# Patient Record
Sex: Female | Born: 1945 | Race: White | Hispanic: No | Smoking: Former smoker
Health system: Southern US, Community
[De-identification: ages and names within clinical notes are randomized; demographics above are authoritative.]

## PROBLEM LIST (undated history)

## (undated) DIAGNOSIS — C801 Malignant (primary) neoplasm, unspecified: Secondary | ICD-10-CM

## (undated) DIAGNOSIS — R609 Edema, unspecified: Secondary | ICD-10-CM

## (undated) DIAGNOSIS — I493 Ventricular premature depolarization: Secondary | ICD-10-CM

## (undated) DIAGNOSIS — Z6821 Body mass index (BMI) 21.0-21.9, adult: Secondary | ICD-10-CM

## (undated) DIAGNOSIS — R918 Other nonspecific abnormal finding of lung field: Secondary | ICD-10-CM

## (undated) DIAGNOSIS — J479 Bronchiectasis, uncomplicated: Secondary | ICD-10-CM

## (undated) DIAGNOSIS — C4362 Malignant melanoma of left upper limb, including shoulder: Secondary | ICD-10-CM

## (undated) DIAGNOSIS — R008 Other abnormalities of heart beat: Secondary | ICD-10-CM

## (undated) DIAGNOSIS — M21612 Bunion of left foot: Secondary | ICD-10-CM

## (undated) DIAGNOSIS — G4733 Obstructive sleep apnea (adult) (pediatric): Secondary | ICD-10-CM

## (undated) DIAGNOSIS — E785 Hyperlipidemia, unspecified: Secondary | ICD-10-CM

## (undated) DIAGNOSIS — M21611 Bunion of right foot: Secondary | ICD-10-CM

## (undated) HISTORY — DX: Body mass index (BMI) 21.0-21.9, adult: Z68.21

## (undated) HISTORY — DX: Malignant melanoma of left upper limb, including shoulder: C43.62

## (undated) HISTORY — DX: Ventricular premature depolarization: I49.3

## (undated) HISTORY — DX: Other nonspecific abnormal finding of lung field: R91.8

## (undated) HISTORY — DX: Bronchiectasis, uncomplicated: J47.9

## (undated) HISTORY — DX: Bunion of right foot: M21.612

## (undated) HISTORY — PX: FOOT SURGERY: SHX648

## (undated) HISTORY — DX: Obstructive sleep apnea (adult) (pediatric): G47.33

## (undated) HISTORY — DX: Bunion of right foot: M21.611

## (undated) HISTORY — DX: Hyperlipidemia, unspecified: E78.5

## (undated) HISTORY — DX: Edema, unspecified: R60.9

## (undated) HISTORY — DX: Other abnormalities of heart beat: R00.8

## (undated) HISTORY — DX: Malignant (primary) neoplasm, unspecified: C80.1

## (undated) HISTORY — PX: OTHER SURGICAL HISTORY: SHX169

---

## 2007-04-24 ENCOUNTER — Encounter: Admission: RE | Admit: 2007-04-24 | Discharge: 2007-04-24 | Payer: Self-pay | Admitting: Family Medicine

## 2007-05-29 ENCOUNTER — Encounter (INDEPENDENT_AMBULATORY_CARE_PROVIDER_SITE_OTHER): Payer: Self-pay | Admitting: General Surgery

## 2007-05-29 ENCOUNTER — Ambulatory Visit (HOSPITAL_BASED_OUTPATIENT_CLINIC_OR_DEPARTMENT_OTHER): Admission: RE | Admit: 2007-05-29 | Discharge: 2007-05-29 | Payer: Self-pay | Admitting: General Surgery

## 2011-05-08 NOTE — Op Note (Signed)
NAME:  Leah, Steele NO.:  [id number]   MEDICAL RECORD NO.:  [id number]          PATIENT TYPE:  AMB   LOCATION:  DSC                          FACILITY:  [facility]   PHYSICIAN:  Cherylynn Ridges, M.D.    DATE OF BIRTH:  [birth year]   DATE OF PROCEDURE:  05/29/2007  DATE OF DISCHARGE:                               OPERATIVE REPORT   PREOPERATIVE DIAGNOSES:  1. Melanoma of left upper extremity, superficial spreading type.  2. Lipoma of the right upper extremity.   POSTOPERATIVE DIAGNOSES:  1. Melanoma of left upper extremity, superficial spreading type.  2. Lipoma of the right upper extremity.   PROCEDURE PERFORMED:  1. Wide excision of melanoma of left upper extremity, superficial      spreading melanoma.  2. Attempted sentinel lymph node biopsy with random axillary fatty and      possible lymph node biopsy.  3. Excision of right upper extremity lipoma.   SURGEON:  Cherylynn Ridges, M.D.   ANESTHESIA:  General with laryngeal airway.   ESTIMATED BLOOD LOSS:  Less than 20 mL.   COMPLICATIONS:  Could not localize the radioactivity or the vital blue  injection in the axilla and therefore could not localize the sentinel  lymph node.   POSTOPERATIVE CONDITION:  Stable.   DESCRIPTION OF PROCEDURE:  The patient was taken to the operating room  and placed on the table in the supine position.  After an adequate  general laryngeal airway anesthetic was administered, we prepped her  actually in stages -- initially doing the right upper lobe lipoma, then  prepping her for the wide excision of the left upper extremity posterior  shoulder melanoma, and then prepping her for the axilla.   With her right arm stretched across the body and tilted towards the  right in the reverse Trendelenburg position, we localized the area on  the right upper extremity above the elbow where she had a firm nodule,  thought to possibly be a lipoma, but could not say for certainty.  We  made a  longitudinal incision and then easily dissected out about a 1.5  cm lipoma with minimal difficulty.  There were several other nodules in  that area.  However, we did not dissect them away or biopsy them.  We  closed this area using a running subcuticular stitch of 5-0 Monocryl.   We next placed the patient's right arm out in a right angle to her body,  and then placed her left arm across her body and rotated her towards the  right and reversed Trendelenburg where she had this 2 cm lesion on the  back of her left shoulder.  We prepped and draped in the usual sterile  manner, exposing this area.  We marked the area of excision and then  made a wide incision around it, getting at least a centimeter margin  from the edge of the previously reflected melanoma.  We took it down to  the muscle fascia, including the subcutaneous tissue.  We irrigated with  saline, and then closed with a 3-0 Vicryl deep to the subcu layer, and  then the skin was closed using interrupted simple stitches of 3-0 nylon.  All counts were correct at this point, and we left it closed.   We then placed the left arm out at right angles to the body, exposing  the axilla.  We prepped and draped in the usual sterile manner.  We used  the needle probe to localize an area of radioactivity at the highest  activity which appeared to be in the mid to upper aspect of the lateral  part of the axilla.  An incision perpendicular to the humerus was made  into the lateral aspect of the axilla and taken down to the tissue.  We  got into the axillary fatty contents.  However, deep in the axilla and  below the axillary vein, there was no radioactive activity.  We spent  several minutes trying to localize activity and the only area of high  radioactivity appeared to be very deep, sort of deeper to the blood  vessels and up towards the bone into the arm contents away from the  axilla.  There was no radioactivity in the true axilla.  After  spending  several minutes looking for sentinel lymph nodes using a Mayo probe and  also visually looking for the vital blue injection, none was noted.  Without wanting to injure any veins or nerves, we decided to just take  some random biopsies of the fatty tissue, perhaps scavenging some lymph  nodes, and we sent those off as specimen.  We closed in two layers with  3-0 Vicryl in the subcu layer; and then the skin was closed using  stainless steel staples.  All counts were correct.  We irrigated with  saline prior to closure.      Cherylynn Ridges, M.D.  Electronically Signed     JOW/MEDQ  D:  05/29/2007  T:  05/29/2007  Job:  [id number]

## 2011-10-11 LAB — BASIC METABOLIC PANEL
Calcium: 9.2
Creatinine, Ser: 0.72
GFR calc Af Amer: >60
GFR calc non Af Amer: >60
Glucose, Bld: 110 — ABNORMAL HIGH
Sodium: 137

## 2011-10-11 LAB — DIFFERENTIAL
Basophils Absolute: 0
Lymphocytes Relative: 12
Monocytes Absolute: 0.3
Neutro Abs: 9.1 — ABNORMAL HIGH
Neutrophils Relative %: 86 — ABNORMAL HIGH

## 2011-10-11 LAB — CBC
Hemoglobin: 13.5
RBC: 4.34
RDW: 12.6

## 2013-09-29 DIAGNOSIS — D2360 Other benign neoplasm of skin of unspecified upper limb, including shoulder: Secondary | ICD-10-CM | POA: Diagnosis not present

## 2013-09-29 DIAGNOSIS — C4330 Malignant melanoma of unspecified part of face: Secondary | ICD-10-CM | POA: Diagnosis not present

## 2013-09-29 DIAGNOSIS — D485 Neoplasm of uncertain behavior of skin: Secondary | ICD-10-CM | POA: Diagnosis not present

## 2013-09-29 DIAGNOSIS — D239 Other benign neoplasm of skin, unspecified: Secondary | ICD-10-CM | POA: Diagnosis not present

## 2013-09-29 DIAGNOSIS — Z85820 Personal history of malignant melanoma of skin: Secondary | ICD-10-CM | POA: Diagnosis not present

## 2013-09-29 DIAGNOSIS — L819 Disorder of pigmentation, unspecified: Secondary | ICD-10-CM | POA: Diagnosis not present

## 2013-10-08 DIAGNOSIS — Z85820 Personal history of malignant melanoma of skin: Secondary | ICD-10-CM | POA: Insufficient documentation

## 2013-10-08 DIAGNOSIS — C44390 Other specified malignant neoplasm of skin of unspecified parts of face: Secondary | ICD-10-CM | POA: Diagnosis not present

## 2013-10-08 HISTORY — DX: Personal history of malignant melanoma of skin: Z85.820

## 2013-10-17 DIAGNOSIS — J069 Acute upper respiratory infection, unspecified: Secondary | ICD-10-CM | POA: Diagnosis not present

## 2013-11-12 ENCOUNTER — Other Ambulatory Visit: Payer: Self-pay | Admitting: Dermatology

## 2013-11-12 DIAGNOSIS — Z85820 Personal history of malignant melanoma of skin: Secondary | ICD-10-CM | POA: Diagnosis not present

## 2013-11-12 DIAGNOSIS — D239 Other benign neoplasm of skin, unspecified: Secondary | ICD-10-CM | POA: Diagnosis not present

## 2013-11-12 DIAGNOSIS — D485 Neoplasm of uncertain behavior of skin: Secondary | ICD-10-CM | POA: Diagnosis not present

## 2013-11-12 DIAGNOSIS — L259 Unspecified contact dermatitis, unspecified cause: Secondary | ICD-10-CM | POA: Diagnosis not present

## 2013-11-13 DIAGNOSIS — D229 Melanocytic nevi, unspecified: Secondary | ICD-10-CM | POA: Insufficient documentation

## 2013-11-13 DIAGNOSIS — D239 Other benign neoplasm of skin, unspecified: Secondary | ICD-10-CM | POA: Diagnosis not present

## 2013-11-13 HISTORY — DX: Melanocytic nevi, unspecified: D22.9

## 2013-11-16 DIAGNOSIS — Z1231 Encounter for screening mammogram for malignant neoplasm of breast: Secondary | ICD-10-CM | POA: Diagnosis not present

## 2013-11-16 DIAGNOSIS — Z124 Encounter for screening for malignant neoplasm of cervix: Secondary | ICD-10-CM | POA: Diagnosis not present

## 2013-11-16 DIAGNOSIS — Z0000 Encounter for general adult medical examination without abnormal findings: Secondary | ICD-10-CM | POA: Diagnosis not present

## 2013-11-16 DIAGNOSIS — R21 Rash and other nonspecific skin eruption: Secondary | ICD-10-CM | POA: Diagnosis not present

## 2013-12-15 DIAGNOSIS — J111 Influenza due to unidentified influenza virus with other respiratory manifestations: Secondary | ICD-10-CM | POA: Diagnosis not present

## 2013-12-28 DIAGNOSIS — R05 Cough: Secondary | ICD-10-CM | POA: Diagnosis not present

## 2013-12-28 DIAGNOSIS — J209 Acute bronchitis, unspecified: Secondary | ICD-10-CM | POA: Diagnosis not present

## 2013-12-28 DIAGNOSIS — R059 Cough, unspecified: Secondary | ICD-10-CM | POA: Diagnosis not present

## 2013-12-31 DIAGNOSIS — Z01818 Encounter for other preprocedural examination: Secondary | ICD-10-CM | POA: Diagnosis not present

## 2014-01-06 DIAGNOSIS — L989 Disorder of the skin and subcutaneous tissue, unspecified: Secondary | ICD-10-CM | POA: Diagnosis not present

## 2014-01-06 DIAGNOSIS — D2330 Other benign neoplasm of skin of unspecified part of face: Secondary | ICD-10-CM | POA: Diagnosis not present

## 2014-01-06 DIAGNOSIS — R918 Other nonspecific abnormal finding of lung field: Secondary | ICD-10-CM | POA: Diagnosis not present

## 2014-01-06 DIAGNOSIS — C439 Malignant melanoma of skin, unspecified: Secondary | ICD-10-CM | POA: Diagnosis not present

## 2014-01-06 DIAGNOSIS — C4330 Malignant melanoma of unspecified part of face: Secondary | ICD-10-CM | POA: Diagnosis not present

## 2014-01-06 DIAGNOSIS — I10 Essential (primary) hypertension: Secondary | ICD-10-CM | POA: Diagnosis not present

## 2014-01-06 DIAGNOSIS — Z85820 Personal history of malignant melanoma of skin: Secondary | ICD-10-CM | POA: Diagnosis not present

## 2014-01-06 DIAGNOSIS — M129 Arthropathy, unspecified: Secondary | ICD-10-CM | POA: Diagnosis not present

## 2014-01-07 DIAGNOSIS — C4330 Malignant melanoma of unspecified part of face: Secondary | ICD-10-CM | POA: Diagnosis not present

## 2014-01-07 DIAGNOSIS — I10 Essential (primary) hypertension: Secondary | ICD-10-CM | POA: Diagnosis not present

## 2014-01-07 DIAGNOSIS — Z85820 Personal history of malignant melanoma of skin: Secondary | ICD-10-CM | POA: Diagnosis not present

## 2014-01-07 DIAGNOSIS — D239 Other benign neoplasm of skin, unspecified: Secondary | ICD-10-CM | POA: Diagnosis not present

## 2014-01-07 DIAGNOSIS — M129 Arthropathy, unspecified: Secondary | ICD-10-CM | POA: Diagnosis not present

## 2014-01-07 DIAGNOSIS — D2330 Other benign neoplasm of skin of unspecified part of face: Secondary | ICD-10-CM | POA: Diagnosis not present

## 2014-01-08 DIAGNOSIS — Z85820 Personal history of malignant melanoma of skin: Secondary | ICD-10-CM | POA: Diagnosis not present

## 2014-01-08 DIAGNOSIS — M129 Arthropathy, unspecified: Secondary | ICD-10-CM | POA: Diagnosis not present

## 2014-01-08 DIAGNOSIS — I10 Essential (primary) hypertension: Secondary | ICD-10-CM | POA: Diagnosis not present

## 2014-01-08 DIAGNOSIS — D2330 Other benign neoplasm of skin of unspecified part of face: Secondary | ICD-10-CM | POA: Diagnosis not present

## 2014-01-09 DIAGNOSIS — M129 Arthropathy, unspecified: Secondary | ICD-10-CM | POA: Diagnosis not present

## 2014-01-09 DIAGNOSIS — I10 Essential (primary) hypertension: Secondary | ICD-10-CM | POA: Diagnosis not present

## 2014-01-09 DIAGNOSIS — Z85820 Personal history of malignant melanoma of skin: Secondary | ICD-10-CM | POA: Diagnosis not present

## 2014-01-09 DIAGNOSIS — D2330 Other benign neoplasm of skin of unspecified part of face: Secondary | ICD-10-CM | POA: Diagnosis not present

## 2014-01-22 DIAGNOSIS — Z85820 Personal history of malignant melanoma of skin: Secondary | ICD-10-CM | POA: Diagnosis not present

## 2014-01-28 DIAGNOSIS — D239 Other benign neoplasm of skin, unspecified: Secondary | ICD-10-CM | POA: Diagnosis not present

## 2014-03-05 DIAGNOSIS — D239 Other benign neoplasm of skin, unspecified: Secondary | ICD-10-CM | POA: Diagnosis not present

## 2014-03-16 DIAGNOSIS — H02119 Cicatricial ectropion of unspecified eye, unspecified eyelid: Secondary | ICD-10-CM | POA: Insufficient documentation

## 2014-03-16 DIAGNOSIS — Z85820 Personal history of malignant melanoma of skin: Secondary | ICD-10-CM | POA: Diagnosis not present

## 2014-03-16 HISTORY — DX: Cicatricial ectropion of unspecified eye, unspecified eyelid: H02.119

## 2014-04-20 DIAGNOSIS — H02119 Cicatricial ectropion of unspecified eye, unspecified eyelid: Secondary | ICD-10-CM | POA: Diagnosis not present

## 2014-05-14 DIAGNOSIS — H02119 Cicatricial ectropion of unspecified eye, unspecified eyelid: Secondary | ICD-10-CM | POA: Diagnosis not present

## 2014-06-14 ENCOUNTER — Encounter: Payer: Self-pay | Admitting: Podiatrist

## 2014-06-14 ENCOUNTER — Ambulatory Visit (INDEPENDENT_AMBULATORY_CARE_PROVIDER_SITE_OTHER): Payer: Medicare Other | Admitting: Podiatrist

## 2014-06-14 VITALS — BP 110/71 | HR 65 | Resp 18

## 2014-06-14 DIAGNOSIS — Q828 Other specified congenital malformations of skin: Secondary | ICD-10-CM | POA: Diagnosis not present

## 2014-06-14 DIAGNOSIS — M2042 Other hammer toe(s) (acquired), left foot: Secondary | ICD-10-CM

## 2014-06-14 DIAGNOSIS — M2040 Other hammer toe(s) (acquired), unspecified foot: Secondary | ICD-10-CM

## 2014-06-14 DIAGNOSIS — M21619 Bunion of unspecified foot: Secondary | ICD-10-CM

## 2014-06-14 DIAGNOSIS — M216X9 Other acquired deformities of unspecified foot: Secondary | ICD-10-CM | POA: Diagnosis not present

## 2014-06-14 DIAGNOSIS — M21612 Bunion of left foot: Secondary | ICD-10-CM

## 2014-06-14 NOTE — Patient Instructions (Addendum)
[employer] or [employer] (IT) in [place] are good places to go for shoes   Corns and Calluses Corns (or Porokeratotic Lesions) are small areas of thickened skin that usually occur on the top, sides, or tip of a toe. They contain a cone-shaped core with a point that can press on a nerve below. This causes pain. Calluses are areas of thickened skin that usually develop on hands, fingers, palms, soles of the feet, and heels. These are areas that experience frequent friction or pressure. CAUSES  Corns are usually the result of rubbing (friction) or pressure from shoes that are too tight or do not fit properly. Calluses are caused by repeated friction and pressure on the affected areas. SYMPTOMS  A hard growth on the skin.  Pain or tenderness under the skin.  Sometimes, redness and swelling.  Increased discomfort while wearing tight-fitting shoes. DIAGNOSIS  Your caregiver can usually tell what the problem is by doing a physical exam. TREATMENT  Removing the cause of the friction or pressure is usually the only treatment needed. However, sometimes medicines can be used to help soften the hardened, thickened areas. These medicines include salicylic acid plasters and 12% ammonium lactate lotion. These medicines should only be used under the direction of your caregiver. HOME CARE INSTRUCTIONS   Try to remove pressure from the affected area.  You may wear donut-shaped corn pads to protect your skin.  You may use a pumice stone or nonmetallic nail file to gently reduce the thickness of a corn.  Wear properly fitted footwear.  If you have calluses on the hands, wear gloves during activities that cause friction.  If you have diabetes, you should regularly examine your feet. Tell your caregiver if you notice any problems with your feet. SEEK IMMEDIATE MEDICAL CARE IF:   You have increased pain, swelling, redness, or warmth in the affected area.  Your corn or callus starts to drain  fluid or bleeds.  You are not getting better, even with treatment. Document Released: 09/15/2004 Document Revised: 03/03/2012 Document Reviewed: 08/07/2011 [facility] Patient Information 2015 [place], [place]. This information is not intended to replace advice given to you by your health care provider. Make sure you discuss any questions you have with your health care provider.

## 2014-06-14 NOTE — Progress Notes (Signed)
   Subjective:    Patient ID: Leah Steele, female    DOB: [birth year], 68 y.o.   MRN: [id number]  HPI I HAVE SOME PLACES ON THE BOTTOM OF BOTH MY FEET AND IT HAS BEEN GOING ON FOR 3 YEARS AND USED DR SHULLS VERSION AND ARE SORE AND TENDER AND CAN'T WALK BAREFOOT AND MY BUNIONS ARE GETTING WORSE AND CAN ONLY WEAR CROCS    Review of Systems  Cardiovascular: Positive for leg swelling.  Musculoskeletal: Positive for gait problem.  All other systems reviewed and are negative.      Objective:   Physical Exam  Patient is awake, alert, and oriented x 3.  In no acute distress.  Vascular status is intact with palpable pedal pulses at 2/4 DP and PT bilateral and capillary refill time within normal limits. Neurological sensation is also intact bilaterally via Semmes Weinstein monofilament at 5/5 sites. Light touch, vibratory sensation, Achilles tendon reflex is intact. Dermatological exam reveals skin color, turger and texture as normal. No open lesions present.  Musculature intact with dorsiflexion, plantarflexion, inversion, eversion.  Porokeratotic lesion present submet 4 left and 5 right, mild bunion deformity present left with a mild hammertoe left 2nd.         Assessment & Plan:  Porokeratotic lesion, plantar flexed metatarsal, bunion, mild hammertoe  Plan:  Debrided the lesions without complication.  Discussed shoe gear and changes to accomidate for the wide forefoot.  She will be seen back prn

## 2014-06-24 DIAGNOSIS — D239 Other benign neoplasm of skin, unspecified: Secondary | ICD-10-CM | POA: Diagnosis not present

## 2014-06-24 DIAGNOSIS — D485 Neoplasm of uncertain behavior of skin: Secondary | ICD-10-CM | POA: Diagnosis not present

## 2014-06-24 DIAGNOSIS — Z85820 Personal history of malignant melanoma of skin: Secondary | ICD-10-CM | POA: Diagnosis not present

## 2014-06-30 DIAGNOSIS — H01119 Allergic dermatitis of unspecified eye, unspecified eyelid: Secondary | ICD-10-CM | POA: Diagnosis not present

## 2014-07-06 DIAGNOSIS — D485 Neoplasm of uncertain behavior of skin: Secondary | ICD-10-CM | POA: Diagnosis not present

## 2014-07-06 DIAGNOSIS — Z85820 Personal history of malignant melanoma of skin: Secondary | ICD-10-CM | POA: Diagnosis not present

## 2014-08-27 DIAGNOSIS — C44390 Other specified malignant neoplasm of skin of unspecified parts of face: Secondary | ICD-10-CM | POA: Diagnosis not present

## 2014-08-27 DIAGNOSIS — D485 Neoplasm of uncertain behavior of skin: Secondary | ICD-10-CM | POA: Diagnosis not present

## 2014-08-27 DIAGNOSIS — L905 Scar conditions and fibrosis of skin: Secondary | ICD-10-CM | POA: Diagnosis not present

## 2014-10-09 DIAGNOSIS — J0100 Acute maxillary sinusitis, unspecified: Secondary | ICD-10-CM | POA: Diagnosis not present

## 2014-10-21 DIAGNOSIS — J209 Acute bronchitis, unspecified: Secondary | ICD-10-CM | POA: Diagnosis not present

## 2014-11-04 DIAGNOSIS — Z23 Encounter for immunization: Secondary | ICD-10-CM | POA: Diagnosis not present

## 2014-11-24 DIAGNOSIS — Z87898 Personal history of other specified conditions: Secondary | ICD-10-CM | POA: Diagnosis not present

## 2014-11-24 DIAGNOSIS — Z86018 Personal history of other benign neoplasm: Secondary | ICD-10-CM | POA: Diagnosis not present

## 2014-11-24 DIAGNOSIS — D239 Other benign neoplasm of skin, unspecified: Secondary | ICD-10-CM | POA: Diagnosis not present

## 2015-05-24 DIAGNOSIS — R05 Cough: Secondary | ICD-10-CM | POA: Diagnosis not present

## 2015-05-24 DIAGNOSIS — M79674 Pain in right toe(s): Secondary | ICD-10-CM | POA: Diagnosis not present

## 2015-06-06 DIAGNOSIS — D225 Melanocytic nevi of trunk: Secondary | ICD-10-CM | POA: Diagnosis not present

## 2015-06-06 DIAGNOSIS — Z87898 Personal history of other specified conditions: Secondary | ICD-10-CM | POA: Diagnosis not present

## 2015-06-06 DIAGNOSIS — Z86018 Personal history of other benign neoplasm: Secondary | ICD-10-CM | POA: Diagnosis not present

## 2015-06-28 DIAGNOSIS — H2513 Age-related nuclear cataract, bilateral: Secondary | ICD-10-CM | POA: Diagnosis not present

## 2015-07-15 DIAGNOSIS — J209 Acute bronchitis, unspecified: Secondary | ICD-10-CM | POA: Diagnosis not present

## 2015-08-05 DIAGNOSIS — H02113 Cicatricial ectropion of right eye, unspecified eyelid: Secondary | ICD-10-CM | POA: Diagnosis not present

## 2015-08-16 DIAGNOSIS — H02112 Cicatricial ectropion of right lower eyelid: Secondary | ICD-10-CM | POA: Insufficient documentation

## 2015-08-16 HISTORY — DX: Cicatricial ectropion of right lower eyelid: H02.112

## 2015-08-24 DIAGNOSIS — H25813 Combined forms of age-related cataract, bilateral: Secondary | ICD-10-CM | POA: Diagnosis not present

## 2015-08-24 DIAGNOSIS — H1852 Epithelial (juvenile) corneal dystrophy: Secondary | ICD-10-CM | POA: Diagnosis not present

## 2015-10-04 DIAGNOSIS — H25811 Combined forms of age-related cataract, right eye: Secondary | ICD-10-CM | POA: Diagnosis not present

## 2015-10-04 DIAGNOSIS — Z87891 Personal history of nicotine dependence: Secondary | ICD-10-CM | POA: Diagnosis not present

## 2015-10-04 DIAGNOSIS — H25813 Combined forms of age-related cataract, bilateral: Secondary | ICD-10-CM | POA: Diagnosis not present

## 2015-10-21 DIAGNOSIS — L259 Unspecified contact dermatitis, unspecified cause: Secondary | ICD-10-CM | POA: Diagnosis not present

## 2015-11-01 DIAGNOSIS — M1990 Unspecified osteoarthritis, unspecified site: Secondary | ICD-10-CM | POA: Diagnosis not present

## 2015-11-01 DIAGNOSIS — H25813 Combined forms of age-related cataract, bilateral: Secondary | ICD-10-CM | POA: Diagnosis not present

## 2015-11-01 DIAGNOSIS — I10 Essential (primary) hypertension: Secondary | ICD-10-CM | POA: Diagnosis not present

## 2015-11-01 DIAGNOSIS — H25812 Combined forms of age-related cataract, left eye: Secondary | ICD-10-CM | POA: Diagnosis not present

## 2015-11-01 DIAGNOSIS — H2701 Aphakia, right eye: Secondary | ICD-10-CM | POA: Diagnosis not present

## 2015-11-01 DIAGNOSIS — Z85820 Personal history of malignant melanoma of skin: Secondary | ICD-10-CM | POA: Diagnosis not present

## 2015-11-01 DIAGNOSIS — Z87891 Personal history of nicotine dependence: Secondary | ICD-10-CM | POA: Diagnosis not present

## 2015-11-10 DIAGNOSIS — Z23 Encounter for immunization: Secondary | ICD-10-CM | POA: Diagnosis not present

## 2015-11-10 DIAGNOSIS — R0683 Snoring: Secondary | ICD-10-CM | POA: Diagnosis not present

## 2015-11-10 DIAGNOSIS — R3915 Urgency of urination: Secondary | ICD-10-CM | POA: Diagnosis not present

## 2015-11-10 DIAGNOSIS — L853 Xerosis cutis: Secondary | ICD-10-CM | POA: Diagnosis not present

## 2015-12-06 DIAGNOSIS — R509 Fever, unspecified: Secondary | ICD-10-CM | POA: Diagnosis not present

## 2016-01-05 DIAGNOSIS — R229 Localized swelling, mass and lump, unspecified: Secondary | ICD-10-CM | POA: Diagnosis not present

## 2016-01-05 DIAGNOSIS — L219 Seborrheic dermatitis, unspecified: Secondary | ICD-10-CM | POA: Diagnosis not present

## 2016-01-05 DIAGNOSIS — Z23 Encounter for immunization: Secondary | ICD-10-CM | POA: Diagnosis not present

## 2016-01-05 DIAGNOSIS — Z87898 Personal history of other specified conditions: Secondary | ICD-10-CM | POA: Diagnosis not present

## 2016-01-05 DIAGNOSIS — R202 Paresthesia of skin: Secondary | ICD-10-CM | POA: Diagnosis not present

## 2016-01-05 DIAGNOSIS — D225 Melanocytic nevi of trunk: Secondary | ICD-10-CM | POA: Diagnosis not present

## 2016-01-05 DIAGNOSIS — Z86018 Personal history of other benign neoplasm: Secondary | ICD-10-CM | POA: Diagnosis not present

## 2016-02-25 DIAGNOSIS — J069 Acute upper respiratory infection, unspecified: Secondary | ICD-10-CM | POA: Diagnosis not present

## 2016-06-09 DIAGNOSIS — J0100 Acute maxillary sinusitis, unspecified: Secondary | ICD-10-CM | POA: Diagnosis not present

## 2016-06-09 DIAGNOSIS — J209 Acute bronchitis, unspecified: Secondary | ICD-10-CM | POA: Diagnosis not present

## 2016-06-22 DIAGNOSIS — J209 Acute bronchitis, unspecified: Secondary | ICD-10-CM | POA: Diagnosis not present

## 2016-07-04 DIAGNOSIS — Z87898 Personal history of other specified conditions: Secondary | ICD-10-CM | POA: Diagnosis not present

## 2016-07-04 DIAGNOSIS — D225 Melanocytic nevi of trunk: Secondary | ICD-10-CM | POA: Diagnosis not present

## 2016-07-04 DIAGNOSIS — Z86018 Personal history of other benign neoplasm: Secondary | ICD-10-CM | POA: Diagnosis not present

## 2016-07-04 DIAGNOSIS — L821 Other seborrheic keratosis: Secondary | ICD-10-CM | POA: Diagnosis not present

## 2016-08-20 ENCOUNTER — Other Ambulatory Visit: Payer: Self-pay

## 2016-08-30 DIAGNOSIS — R5383 Other fatigue: Secondary | ICD-10-CM | POA: Diagnosis not present

## 2016-08-30 DIAGNOSIS — Z1231 Encounter for screening mammogram for malignant neoplasm of breast: Secondary | ICD-10-CM | POA: Diagnosis not present

## 2016-08-30 DIAGNOSIS — E785 Hyperlipidemia, unspecified: Secondary | ICD-10-CM | POA: Diagnosis not present

## 2016-08-30 DIAGNOSIS — Z124 Encounter for screening for malignant neoplasm of cervix: Secondary | ICD-10-CM | POA: Diagnosis not present

## 2016-08-30 DIAGNOSIS — Z0001 Encounter for general adult medical examination with abnormal findings: Secondary | ICD-10-CM | POA: Diagnosis not present

## 2016-08-30 DIAGNOSIS — R3915 Urgency of urination: Secondary | ICD-10-CM | POA: Diagnosis not present

## 2016-08-30 DIAGNOSIS — M79671 Pain in right foot: Secondary | ICD-10-CM | POA: Diagnosis not present

## 2016-08-30 DIAGNOSIS — M79672 Pain in left foot: Secondary | ICD-10-CM | POA: Diagnosis not present

## 2016-08-30 DIAGNOSIS — R0683 Snoring: Secondary | ICD-10-CM | POA: Diagnosis not present

## 2016-08-30 DIAGNOSIS — Z1389 Encounter for screening for other disorder: Secondary | ICD-10-CM | POA: Diagnosis not present

## 2016-09-24 DIAGNOSIS — J0100 Acute maxillary sinusitis, unspecified: Secondary | ICD-10-CM | POA: Diagnosis not present

## 2016-10-24 DIAGNOSIS — H6983 Other specified disorders of Eustachian tube, bilateral: Secondary | ICD-10-CM | POA: Diagnosis not present

## 2016-10-24 DIAGNOSIS — L659 Nonscarring hair loss, unspecified: Secondary | ICD-10-CM | POA: Diagnosis not present

## 2016-10-24 DIAGNOSIS — Z23 Encounter for immunization: Secondary | ICD-10-CM | POA: Diagnosis not present

## 2016-10-24 DIAGNOSIS — R5383 Other fatigue: Secondary | ICD-10-CM | POA: Diagnosis not present

## 2016-11-02 DIAGNOSIS — R0683 Snoring: Secondary | ICD-10-CM | POA: Diagnosis not present

## 2016-11-02 DIAGNOSIS — G4710 Hypersomnia, unspecified: Secondary | ICD-10-CM | POA: Diagnosis not present

## 2016-11-02 DIAGNOSIS — R0681 Apnea, not elsewhere classified: Secondary | ICD-10-CM | POA: Diagnosis not present

## 2016-12-18 DIAGNOSIS — J3489 Other specified disorders of nose and nasal sinuses: Secondary | ICD-10-CM | POA: Diagnosis not present

## 2017-01-01 DIAGNOSIS — L650 Telogen effluvium: Secondary | ICD-10-CM | POA: Diagnosis not present

## 2017-01-25 DIAGNOSIS — R05 Cough: Secondary | ICD-10-CM | POA: Diagnosis not present

## 2017-01-26 DIAGNOSIS — R05 Cough: Secondary | ICD-10-CM | POA: Diagnosis not present

## 2017-02-19 DIAGNOSIS — I781 Nevus, non-neoplastic: Secondary | ICD-10-CM | POA: Diagnosis not present

## 2017-02-19 DIAGNOSIS — Z87898 Personal history of other specified conditions: Secondary | ICD-10-CM | POA: Diagnosis not present

## 2017-02-19 DIAGNOSIS — Z86018 Personal history of other benign neoplasm: Secondary | ICD-10-CM | POA: Diagnosis not present

## 2017-02-19 DIAGNOSIS — D1724 Benign lipomatous neoplasm of skin and subcutaneous tissue of left leg: Secondary | ICD-10-CM | POA: Diagnosis not present

## 2017-02-19 DIAGNOSIS — L821 Other seborrheic keratosis: Secondary | ICD-10-CM | POA: Diagnosis not present

## 2017-02-19 DIAGNOSIS — D1800 Hemangioma unspecified site: Secondary | ICD-10-CM | POA: Diagnosis not present

## 2017-02-19 DIAGNOSIS — L814 Other melanin hyperpigmentation: Secondary | ICD-10-CM | POA: Diagnosis not present

## 2017-02-19 DIAGNOSIS — D225 Melanocytic nevi of trunk: Secondary | ICD-10-CM | POA: Diagnosis not present

## 2017-03-12 DIAGNOSIS — M25462 Effusion, left knee: Secondary | ICD-10-CM | POA: Diagnosis not present

## 2017-03-12 DIAGNOSIS — M25562 Pain in left knee: Secondary | ICD-10-CM | POA: Diagnosis not present

## 2017-03-28 DIAGNOSIS — Z961 Presence of intraocular lens: Secondary | ICD-10-CM | POA: Diagnosis not present

## 2017-07-15 DIAGNOSIS — M79672 Pain in left foot: Secondary | ICD-10-CM | POA: Diagnosis not present

## 2017-07-15 DIAGNOSIS — M2011 Hallux valgus (acquired), right foot: Secondary | ICD-10-CM | POA: Diagnosis not present

## 2017-07-15 DIAGNOSIS — M2012 Hallux valgus (acquired), left foot: Secondary | ICD-10-CM | POA: Diagnosis not present

## 2017-07-15 DIAGNOSIS — M2010 Hallux valgus (acquired), unspecified foot: Secondary | ICD-10-CM

## 2017-07-15 HISTORY — DX: Hallux valgus (acquired), unspecified foot: M20.10

## 2017-07-16 DIAGNOSIS — D225 Melanocytic nevi of trunk: Secondary | ICD-10-CM | POA: Diagnosis not present

## 2017-07-16 DIAGNOSIS — L814 Other melanin hyperpigmentation: Secondary | ICD-10-CM | POA: Diagnosis not present

## 2017-07-16 DIAGNOSIS — Z86018 Personal history of other benign neoplasm: Secondary | ICD-10-CM | POA: Diagnosis not present

## 2017-07-16 DIAGNOSIS — L821 Other seborrheic keratosis: Secondary | ICD-10-CM | POA: Diagnosis not present

## 2017-07-16 DIAGNOSIS — L2084 Intrinsic (allergic) eczema: Secondary | ICD-10-CM | POA: Diagnosis not present

## 2017-07-16 DIAGNOSIS — Z87898 Personal history of other specified conditions: Secondary | ICD-10-CM | POA: Diagnosis not present

## 2017-07-16 DIAGNOSIS — D1800 Hemangioma unspecified site: Secondary | ICD-10-CM | POA: Diagnosis not present

## 2017-08-06 DIAGNOSIS — X58XXXA Exposure to other specified factors, initial encounter: Secondary | ICD-10-CM | POA: Diagnosis not present

## 2017-08-06 DIAGNOSIS — M2012 Hallux valgus (acquired), left foot: Secondary | ICD-10-CM | POA: Diagnosis not present

## 2017-08-06 DIAGNOSIS — M79672 Pain in left foot: Secondary | ICD-10-CM | POA: Diagnosis not present

## 2017-08-06 DIAGNOSIS — S93141A Subluxation of metatarsophalangeal joint of right great toe, initial encounter: Secondary | ICD-10-CM | POA: Diagnosis not present

## 2017-08-06 DIAGNOSIS — M2011 Hallux valgus (acquired), right foot: Secondary | ICD-10-CM | POA: Diagnosis not present

## 2017-08-21 DIAGNOSIS — M2020 Hallux rigidus, unspecified foot: Secondary | ICD-10-CM

## 2017-08-21 DIAGNOSIS — M2012 Hallux valgus (acquired), left foot: Secondary | ICD-10-CM | POA: Diagnosis not present

## 2017-08-21 DIAGNOSIS — M2022 Hallux rigidus, left foot: Secondary | ICD-10-CM | POA: Diagnosis not present

## 2017-08-21 DIAGNOSIS — M2021 Hallux rigidus, right foot: Secondary | ICD-10-CM | POA: Diagnosis not present

## 2017-08-21 DIAGNOSIS — M2011 Hallux valgus (acquired), right foot: Secondary | ICD-10-CM | POA: Diagnosis not present

## 2017-08-21 HISTORY — DX: Hallux rigidus, unspecified foot: M20.20

## 2017-10-23 DIAGNOSIS — L300 Nummular dermatitis: Secondary | ICD-10-CM | POA: Diagnosis not present

## 2017-10-23 DIAGNOSIS — Z23 Encounter for immunization: Secondary | ICD-10-CM | POA: Diagnosis not present

## 2017-11-05 DIAGNOSIS — Z1231 Encounter for screening mammogram for malignant neoplasm of breast: Secondary | ICD-10-CM | POA: Diagnosis not present

## 2017-11-05 DIAGNOSIS — M25569 Pain in unspecified knee: Secondary | ICD-10-CM | POA: Diagnosis not present

## 2017-11-05 DIAGNOSIS — E2839 Other primary ovarian failure: Secondary | ICD-10-CM | POA: Diagnosis not present

## 2017-11-05 DIAGNOSIS — Z0001 Encounter for general adult medical examination with abnormal findings: Secondary | ICD-10-CM | POA: Diagnosis not present

## 2017-11-05 DIAGNOSIS — Z1389 Encounter for screening for other disorder: Secondary | ICD-10-CM | POA: Diagnosis not present

## 2017-11-05 DIAGNOSIS — Z23 Encounter for immunization: Secondary | ICD-10-CM | POA: Diagnosis not present

## 2017-12-30 DIAGNOSIS — L84 Corns and callosities: Secondary | ICD-10-CM

## 2017-12-30 DIAGNOSIS — M79671 Pain in right foot: Secondary | ICD-10-CM | POA: Diagnosis not present

## 2017-12-30 DIAGNOSIS — M7741 Metatarsalgia, right foot: Secondary | ICD-10-CM | POA: Insufficient documentation

## 2017-12-30 DIAGNOSIS — M21612 Bunion of left foot: Secondary | ICD-10-CM | POA: Diagnosis not present

## 2017-12-30 DIAGNOSIS — M7742 Metatarsalgia, left foot: Secondary | ICD-10-CM | POA: Diagnosis not present

## 2017-12-30 DIAGNOSIS — M21611 Bunion of right foot: Secondary | ICD-10-CM | POA: Diagnosis not present

## 2017-12-30 DIAGNOSIS — M79672 Pain in left foot: Secondary | ICD-10-CM | POA: Diagnosis not present

## 2017-12-30 HISTORY — DX: Corns and callosities: L84

## 2017-12-30 HISTORY — DX: Metatarsalgia, right foot: M77.41

## 2018-01-15 DIAGNOSIS — M2022 Hallux rigidus, left foot: Secondary | ICD-10-CM | POA: Diagnosis not present

## 2018-01-15 DIAGNOSIS — M2012 Hallux valgus (acquired), left foot: Secondary | ICD-10-CM | POA: Diagnosis not present

## 2018-01-15 DIAGNOSIS — M2021 Hallux rigidus, right foot: Secondary | ICD-10-CM | POA: Diagnosis not present

## 2018-01-15 DIAGNOSIS — M2011 Hallux valgus (acquired), right foot: Secondary | ICD-10-CM | POA: Diagnosis not present

## 2018-01-21 DIAGNOSIS — M2022 Hallux rigidus, left foot: Secondary | ICD-10-CM | POA: Diagnosis not present

## 2018-01-21 DIAGNOSIS — M2012 Hallux valgus (acquired), left foot: Secondary | ICD-10-CM | POA: Diagnosis not present

## 2018-01-22 DIAGNOSIS — M2012 Hallux valgus (acquired), left foot: Secondary | ICD-10-CM | POA: Diagnosis not present

## 2018-02-12 DIAGNOSIS — M2021 Hallux rigidus, right foot: Secondary | ICD-10-CM | POA: Diagnosis not present

## 2018-02-12 DIAGNOSIS — M2022 Hallux rigidus, left foot: Secondary | ICD-10-CM | POA: Diagnosis not present

## 2018-02-12 DIAGNOSIS — M2012 Hallux valgus (acquired), left foot: Secondary | ICD-10-CM | POA: Diagnosis not present

## 2018-02-12 DIAGNOSIS — M2011 Hallux valgus (acquired), right foot: Secondary | ICD-10-CM | POA: Diagnosis not present

## 2018-02-15 DIAGNOSIS — J069 Acute upper respiratory infection, unspecified: Secondary | ICD-10-CM | POA: Diagnosis not present

## 2018-02-19 DIAGNOSIS — M2012 Hallux valgus (acquired), left foot: Secondary | ICD-10-CM | POA: Diagnosis not present

## 2018-02-19 DIAGNOSIS — M2022 Hallux rigidus, left foot: Secondary | ICD-10-CM | POA: Diagnosis not present

## 2018-02-26 DIAGNOSIS — M2022 Hallux rigidus, left foot: Secondary | ICD-10-CM | POA: Diagnosis not present

## 2018-02-26 DIAGNOSIS — M2012 Hallux valgus (acquired), left foot: Secondary | ICD-10-CM | POA: Diagnosis not present

## 2018-03-02 DIAGNOSIS — J069 Acute upper respiratory infection, unspecified: Secondary | ICD-10-CM | POA: Diagnosis not present

## 2018-03-24 DIAGNOSIS — R05 Cough: Secondary | ICD-10-CM | POA: Diagnosis not present

## 2018-03-24 DIAGNOSIS — R918 Other nonspecific abnormal finding of lung field: Secondary | ICD-10-CM | POA: Diagnosis not present

## 2018-03-24 DIAGNOSIS — J069 Acute upper respiratory infection, unspecified: Secondary | ICD-10-CM | POA: Diagnosis not present

## 2018-03-28 DIAGNOSIS — R918 Other nonspecific abnormal finding of lung field: Secondary | ICD-10-CM | POA: Diagnosis not present

## 2018-03-28 DIAGNOSIS — J069 Acute upper respiratory infection, unspecified: Secondary | ICD-10-CM | POA: Diagnosis not present

## 2018-03-28 DIAGNOSIS — R911 Solitary pulmonary nodule: Secondary | ICD-10-CM | POA: Diagnosis not present

## 2018-04-15 DIAGNOSIS — R911 Solitary pulmonary nodule: Secondary | ICD-10-CM | POA: Diagnosis not present

## 2018-04-15 DIAGNOSIS — A310 Pulmonary mycobacterial infection: Secondary | ICD-10-CM | POA: Diagnosis not present

## 2018-04-22 DIAGNOSIS — Z87898 Personal history of other specified conditions: Secondary | ICD-10-CM | POA: Diagnosis not present

## 2018-04-22 DIAGNOSIS — D1800 Hemangioma unspecified site: Secondary | ICD-10-CM | POA: Diagnosis not present

## 2018-04-22 DIAGNOSIS — L821 Other seborrheic keratosis: Secondary | ICD-10-CM | POA: Diagnosis not present

## 2018-04-22 DIAGNOSIS — L814 Other melanin hyperpigmentation: Secondary | ICD-10-CM | POA: Diagnosis not present

## 2018-04-22 DIAGNOSIS — Z86018 Personal history of other benign neoplasm: Secondary | ICD-10-CM | POA: Diagnosis not present

## 2018-04-22 DIAGNOSIS — D225 Melanocytic nevi of trunk: Secondary | ICD-10-CM | POA: Diagnosis not present

## 2018-04-23 DIAGNOSIS — M2012 Hallux valgus (acquired), left foot: Secondary | ICD-10-CM | POA: Diagnosis not present

## 2018-04-23 DIAGNOSIS — M2022 Hallux rigidus, left foot: Secondary | ICD-10-CM | POA: Diagnosis not present

## 2018-05-08 ENCOUNTER — Ambulatory Visit (INDEPENDENT_AMBULATORY_CARE_PROVIDER_SITE_OTHER): Payer: Medicare Other | Admitting: Pulmonary Disease

## 2018-05-08 ENCOUNTER — Encounter: Payer: Self-pay | Admitting: Pulmonary Disease

## 2018-05-08 ENCOUNTER — Other Ambulatory Visit (INDEPENDENT_AMBULATORY_CARE_PROVIDER_SITE_OTHER): Payer: Medicare Other

## 2018-05-08 VITALS — BP 136/82 | HR 85 | Ht 64.0 in | Wt 170.4 lb

## 2018-05-08 DIAGNOSIS — R05 Cough: Secondary | ICD-10-CM

## 2018-05-08 DIAGNOSIS — R059 Cough, unspecified: Secondary | ICD-10-CM

## 2018-05-08 DIAGNOSIS — R911 Solitary pulmonary nodule: Secondary | ICD-10-CM

## 2018-05-08 LAB — CBC WITH DIFFERENTIAL/PLATELET
BASOS ABS: 0.1 10*3/uL (ref 0.0–0.1)
Basophils Relative: 0.7 % (ref 0.0–3.0)
EOS PCT: 1.2 % (ref 0.0–5.0)
Eosinophils Absolute: 0.1 10*3/uL (ref 0.0–0.7)
HEMATOCRIT: 45.3 % (ref 36.0–46.0)
Hemoglobin: 15.1 g/dL — ABNORMAL HIGH (ref 12.0–15.0)
LYMPHS ABS: 2 10*3/uL (ref 0.7–4.0)
LYMPHS PCT: 25.1 % (ref 12.0–46.0)
MCHC: 33.3 g/dL (ref 30.0–36.0)
MCV: 94.9 fl (ref 78.0–100.0)
Monocytes Absolute: 0.6 10*3/uL (ref 0.1–1.0)
Monocytes Relative: 7 % (ref 3.0–12.0)
Neutro Abs: 5.3 10*3/uL (ref 1.4–7.7)
Neutrophils Relative %: 66 % (ref 43.0–77.0)
Platelets: 240 10*3/uL (ref 150.0–400.0)
RBC: 4.77 Mil/uL (ref 3.87–5.11)
RDW: 13.5 % (ref 11.5–15.5)
WBC: 8 10*3/uL (ref 4.0–10.5)

## 2018-05-08 NOTE — Patient Instructions (Signed)
We get a follow-up CT without contrast in 2 months time for follow-up of lung nodules We will check some blood work today including CBC differential, blood allergy profile, hypersensitivity panel, mold antibody panel and schedule you for pulmonary function test We will give you sputum cup.  If you are able to produce sputum we will check cultures for AFB, fungus, regular cultures Follow-up after CT scan in 2 months.
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
## 2018-05-09 LAB — RESPIRATORY ALLERGY PROFILE REGION II ~~LOC~~
Allergen, Cottonwood, t14: <0.1 kU/L
Allergen, Mouse Urine Protein, e78: <0.1 kU/L
Allergen, Oak,t7: <0.1 kU/L
Aspergillus fumigatus, m3: <0.1 kU/L
Bermuda Grass: <0.1 kU/L
CLASS: 0
CLASS: 0
CLASS: 0
CLASS: 0
CLASS: 0
CLASS: 0
CLASS: 0
Class: 0
Class: 0
Class: 0
Class: 0
Class: 0
Class: 0
Class: 0
Class: 0
Class: 0
Class: 0
Class: 0
Class: 0
Class: 0
Class: 0
Class: 0
Class: 0
Class: 0
Cockroach: <0.1 kU/L
D. farinae: <0.1 kU/L
Rough Pigweed  IgE: <0.1 kU/L
Sheep Sorrel IgE: <0.1 kU/L
Timothy Grass: <0.1 kU/L

## 2018-05-09 LAB — ALLERGY PANEL 11, MOLD GROUP
Aspergillus fumigatus, m3: <0.1 kU/L
CLADOSPORIUM HERBARUM (M2) IGE: <0.1 kU/L
CLASS: 0
CLASS: 0
CLASS: 0
Class: 0
Class: 0

## 2018-05-09 LAB — INTERPRETATION:

## 2018-05-12 LAB — HYPERSENSITIVITY PNEUMONITIS
A. Pullulans Abs: NEGATIVE
A.Fumigatus #1 Abs: NEGATIVE
Micropolyspora faeni, IgG: NEGATIVE
Pigeon Serum Abs: NEGATIVE
THERMOACTINOMYCES VULGARIS IGG: NEGATIVE
Thermoact. Saccharii: NEGATIVE

## 2018-05-14 DIAGNOSIS — Z961 Presence of intraocular lens: Secondary | ICD-10-CM | POA: Diagnosis not present

## 2018-05-14 DIAGNOSIS — H26493 Other secondary cataract, bilateral: Secondary | ICD-10-CM | POA: Diagnosis not present

## 2018-05-21 DIAGNOSIS — M2021 Hallux rigidus, right foot: Secondary | ICD-10-CM | POA: Diagnosis not present

## 2018-05-21 DIAGNOSIS — M2011 Hallux valgus (acquired), right foot: Secondary | ICD-10-CM | POA: Diagnosis not present

## 2018-05-21 DIAGNOSIS — M2012 Hallux valgus (acquired), left foot: Secondary | ICD-10-CM | POA: Diagnosis not present

## 2018-05-21 DIAGNOSIS — M2022 Hallux rigidus, left foot: Secondary | ICD-10-CM | POA: Diagnosis not present

## 2018-05-21 DIAGNOSIS — Z85820 Personal history of malignant melanoma of skin: Secondary | ICD-10-CM | POA: Diagnosis not present

## 2018-06-09 ENCOUNTER — Telehealth: Payer: Self-pay | Admitting: Pulmonary Disease

## 2018-06-09 NOTE — Telephone Encounter (Signed)
Notes recorded by Marshell Garfinkel, MD on 05/27/2018 at 5:48 PM EDT Please let patient know labs are normal. ---------------------------------- Spoke with pt. She is aware of results. Nothing further was needed.

## 2018-06-10 DIAGNOSIS — M2021 Hallux rigidus, right foot: Secondary | ICD-10-CM | POA: Diagnosis not present

## 2018-06-10 DIAGNOSIS — M2011 Hallux valgus (acquired), right foot: Secondary | ICD-10-CM | POA: Diagnosis not present

## 2018-06-11 DIAGNOSIS — M2011 Hallux valgus (acquired), right foot: Secondary | ICD-10-CM | POA: Diagnosis not present

## 2018-06-25 DIAGNOSIS — M2011 Hallux valgus (acquired), right foot: Secondary | ICD-10-CM | POA: Diagnosis not present

## 2018-07-09 DIAGNOSIS — M2021 Hallux rigidus, right foot: Secondary | ICD-10-CM | POA: Diagnosis not present

## 2018-07-09 DIAGNOSIS — M2011 Hallux valgus (acquired), right foot: Secondary | ICD-10-CM | POA: Diagnosis not present

## 2018-07-15 ENCOUNTER — Inpatient Hospital Stay: Admission: RE | Admit: 2018-07-15 | Payer: Medicare Other | Source: Ambulatory Visit

## 2018-07-21 ENCOUNTER — Ambulatory Visit (INDEPENDENT_AMBULATORY_CARE_PROVIDER_SITE_OTHER)
Admission: RE | Admit: 2018-07-21 | Discharge: 2018-07-21 | Disposition: A | Discharge status: Home / Self Care | Payer: Medicare Other | Source: Ambulatory Visit | Attending: Pulmonary Disease | Admitting: Pulmonary Disease

## 2018-07-21 DIAGNOSIS — R911 Solitary pulmonary nodule: Secondary | ICD-10-CM

## 2018-07-21 DIAGNOSIS — R918 Other nonspecific abnormal finding of lung field: Secondary | ICD-10-CM | POA: Diagnosis not present

## 2018-07-22 ENCOUNTER — Encounter: Payer: Self-pay | Admitting: Pulmonary Disease

## 2018-07-22 ENCOUNTER — Ambulatory Visit (INDEPENDENT_AMBULATORY_CARE_PROVIDER_SITE_OTHER): Payer: Medicare Other | Admitting: Pulmonary Disease

## 2018-07-22 DIAGNOSIS — R059 Cough, unspecified: Secondary | ICD-10-CM

## 2018-07-22 DIAGNOSIS — R911 Solitary pulmonary nodule: Secondary | ICD-10-CM

## 2018-07-22 DIAGNOSIS — R05 Cough: Secondary | ICD-10-CM

## 2018-07-22 LAB — PULMONARY FUNCTION TEST
DL/VA % pred: 98 %
DL/VA: 4.71 ml/min/mmHg/L
DLCO UNC % PRED: 71 %
DLCO UNC: 17.39 ml/min/mmHg
FEF 25-75 PRE: 1 L/s
FEF 25-75 Post: 1.11 L/sec
FEF2575-%Change-Post: 11 %
FEF2575-%PRED-PRE: 54 %
FEF2575-%Pred-Post: 60 %
FEV1-%CHANGE-POST: 3 %
FEV1-%PRED-POST: 66 %
FEV1-%Pred-Pre: 64 %
FEV1-POST: 1.48 L
FEV1-Pre: 1.43 L
FEV1FVC-%Change-Post: 2 %
FEV1FVC-%Pred-Pre: 96 %
FEV6-%CHANGE-POST: 0 %
FEV6-%PRED-POST: 69 %
FEV6-%PRED-PRE: 69 %
FEV6-POST: 1.96 L
FEV6-PRE: 1.96 L
FEV6FVC-%PRED-POST: 104 %
FEV6FVC-%PRED-PRE: 104 %
FVC-%Change-Post: 0 %
FVC-%Pred-Post: 66 %
FVC-%Pred-Pre: 66 %
FVC-Post: 1.96 L
FVC-Pre: 1.96 L
POST FEV6/FVC RATIO: 100 %
PRE FEV6/FVC RATIO: 100 %
Post FEV1/FVC ratio: 75 %
Pre FEV1/FVC ratio: 73 %
RV % PRED: 78 %
RV: 1.73 L
TLC % PRED: 77 %
TLC: 3.9 L
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
## 2018-07-22 NOTE — Progress Notes (Signed)
PFT completed 07/22/18

## 2018-07-22 NOTE — Patient Instructions (Addendum)
Your CT scan shows improving lung nodules which are likely inflammatory Will order a CT without contrast in 1 year for follow-up Follow-up in clinic after CT scan.

## 2018-07-23 DIAGNOSIS — M2012 Hallux valgus (acquired), left foot: Secondary | ICD-10-CM | POA: Diagnosis not present

## 2018-08-13 DIAGNOSIS — M2021 Hallux rigidus, right foot: Secondary | ICD-10-CM | POA: Diagnosis not present

## 2018-08-13 DIAGNOSIS — M2011 Hallux valgus (acquired), right foot: Secondary | ICD-10-CM | POA: Diagnosis not present

## 2018-08-18 DIAGNOSIS — S90421A Blister (nonthermal), right great toe, initial encounter: Secondary | ICD-10-CM

## 2018-08-18 HISTORY — DX: Blister (nonthermal), right great toe, initial encounter: S90.421A

## 2018-09-11 DIAGNOSIS — J019 Acute sinusitis, unspecified: Secondary | ICD-10-CM | POA: Diagnosis not present

## 2018-09-11 DIAGNOSIS — J208 Acute bronchitis due to other specified organisms: Secondary | ICD-10-CM | POA: Diagnosis not present

## 2018-09-11 DIAGNOSIS — J309 Allergic rhinitis, unspecified: Secondary | ICD-10-CM | POA: Diagnosis not present

## 2018-10-18 DIAGNOSIS — R0981 Nasal congestion: Secondary | ICD-10-CM | POA: Diagnosis not present

## 2018-10-18 DIAGNOSIS — H5713 Ocular pain, bilateral: Secondary | ICD-10-CM | POA: Diagnosis not present

## 2018-10-18 DIAGNOSIS — Z0101 Encounter for examination of eyes and vision with abnormal findings: Secondary | ICD-10-CM | POA: Diagnosis not present

## 2018-10-27 DIAGNOSIS — Z1159 Encounter for screening for other viral diseases: Secondary | ICD-10-CM | POA: Diagnosis not present

## 2018-10-27 DIAGNOSIS — R001 Bradycardia, unspecified: Secondary | ICD-10-CM | POA: Diagnosis not present

## 2018-10-27 DIAGNOSIS — R05 Cough: Secondary | ICD-10-CM | POA: Diagnosis not present

## 2018-10-27 DIAGNOSIS — I493 Ventricular premature depolarization: Secondary | ICD-10-CM | POA: Diagnosis not present

## 2018-11-10 ENCOUNTER — Other Ambulatory Visit: Payer: Self-pay

## 2018-11-22 DIAGNOSIS — Z23 Encounter for immunization: Secondary | ICD-10-CM | POA: Diagnosis not present

## 2018-11-25 DIAGNOSIS — D225 Melanocytic nevi of trunk: Secondary | ICD-10-CM | POA: Diagnosis not present

## 2018-11-25 DIAGNOSIS — L659 Nonscarring hair loss, unspecified: Secondary | ICD-10-CM | POA: Diagnosis not present

## 2018-11-25 DIAGNOSIS — L814 Other melanin hyperpigmentation: Secondary | ICD-10-CM | POA: Diagnosis not present

## 2018-11-25 DIAGNOSIS — Z87898 Personal history of other specified conditions: Secondary | ICD-10-CM | POA: Diagnosis not present

## 2018-11-25 DIAGNOSIS — Z23 Encounter for immunization: Secondary | ICD-10-CM | POA: Diagnosis not present

## 2018-11-25 DIAGNOSIS — Z86018 Personal history of other benign neoplasm: Secondary | ICD-10-CM | POA: Diagnosis not present

## 2018-11-25 DIAGNOSIS — L821 Other seborrheic keratosis: Secondary | ICD-10-CM | POA: Diagnosis not present

## 2018-11-26 DIAGNOSIS — R5383 Other fatigue: Secondary | ICD-10-CM | POA: Diagnosis not present

## 2018-11-26 DIAGNOSIS — E559 Vitamin D deficiency, unspecified: Secondary | ICD-10-CM | POA: Diagnosis not present

## 2018-11-26 DIAGNOSIS — G4733 Obstructive sleep apnea (adult) (pediatric): Secondary | ICD-10-CM | POA: Diagnosis not present

## 2018-11-26 DIAGNOSIS — J479 Bronchiectasis, uncomplicated: Secondary | ICD-10-CM | POA: Diagnosis not present

## 2018-11-30 DIAGNOSIS — G4733 Obstructive sleep apnea (adult) (pediatric): Secondary | ICD-10-CM | POA: Diagnosis not present

## 2018-12-08 DIAGNOSIS — J301 Allergic rhinitis due to pollen: Secondary | ICD-10-CM | POA: Diagnosis not present

## 2018-12-08 DIAGNOSIS — R5383 Other fatigue: Secondary | ICD-10-CM | POA: Diagnosis not present

## 2018-12-08 DIAGNOSIS — J479 Bronchiectasis, uncomplicated: Secondary | ICD-10-CM | POA: Diagnosis not present

## 2018-12-08 DIAGNOSIS — T148XXA Other injury of unspecified body region, initial encounter: Secondary | ICD-10-CM | POA: Diagnosis not present

## 2018-12-08 DIAGNOSIS — R52 Pain, unspecified: Secondary | ICD-10-CM | POA: Diagnosis not present

## 2018-12-08 DIAGNOSIS — Z712 Person consulting for explanation of examination or test findings: Secondary | ICD-10-CM | POA: Diagnosis not present

## 2018-12-08 DIAGNOSIS — G4733 Obstructive sleep apnea (adult) (pediatric): Secondary | ICD-10-CM | POA: Diagnosis not present

## 2018-12-09 DIAGNOSIS — G4733 Obstructive sleep apnea (adult) (pediatric): Secondary | ICD-10-CM | POA: Diagnosis not present

## 2018-12-23 DIAGNOSIS — G4733 Obstructive sleep apnea (adult) (pediatric): Secondary | ICD-10-CM | POA: Diagnosis not present

## 2018-12-23 DIAGNOSIS — J479 Bronchiectasis, uncomplicated: Secondary | ICD-10-CM | POA: Diagnosis not present

## 2018-12-23 DIAGNOSIS — J301 Allergic rhinitis due to pollen: Secondary | ICD-10-CM | POA: Diagnosis not present

## 2018-12-23 DIAGNOSIS — R5383 Other fatigue: Secondary | ICD-10-CM | POA: Diagnosis not present

## 2019-01-13 DIAGNOSIS — Z1231 Encounter for screening mammogram for malignant neoplasm of breast: Secondary | ICD-10-CM | POA: Diagnosis not present

## 2019-01-13 DIAGNOSIS — Z0001 Encounter for general adult medical examination with abnormal findings: Secondary | ICD-10-CM | POA: Diagnosis not present

## 2019-01-13 DIAGNOSIS — E669 Obesity, unspecified: Secondary | ICD-10-CM | POA: Diagnosis not present

## 2019-01-13 DIAGNOSIS — Z1331 Encounter for screening for depression: Secondary | ICD-10-CM | POA: Diagnosis not present

## 2019-01-13 DIAGNOSIS — R1900 Intra-abdominal and pelvic swelling, mass and lump, unspecified site: Secondary | ICD-10-CM | POA: Diagnosis not present

## 2019-01-13 DIAGNOSIS — Z1339 Encounter for screening examination for other mental health and behavioral disorders: Secondary | ICD-10-CM | POA: Diagnosis not present

## 2019-01-13 DIAGNOSIS — Z6830 Body mass index (BMI) 30.0-30.9, adult: Secondary | ICD-10-CM | POA: Diagnosis not present

## 2019-01-13 DIAGNOSIS — Z124 Encounter for screening for malignant neoplasm of cervix: Secondary | ICD-10-CM | POA: Diagnosis not present

## 2019-01-19 DIAGNOSIS — D259 Leiomyoma of uterus, unspecified: Secondary | ICD-10-CM | POA: Diagnosis not present

## 2019-01-19 DIAGNOSIS — R1900 Intra-abdominal and pelvic swelling, mass and lump, unspecified site: Secondary | ICD-10-CM | POA: Diagnosis not present

## 2019-01-20 DIAGNOSIS — G4733 Obstructive sleep apnea (adult) (pediatric): Secondary | ICD-10-CM | POA: Diagnosis not present

## 2019-02-16 DIAGNOSIS — J309 Allergic rhinitis, unspecified: Secondary | ICD-10-CM | POA: Diagnosis not present

## 2019-02-16 DIAGNOSIS — Z6830 Body mass index (BMI) 30.0-30.9, adult: Secondary | ICD-10-CM | POA: Diagnosis not present

## 2019-02-16 DIAGNOSIS — G4733 Obstructive sleep apnea (adult) (pediatric): Secondary | ICD-10-CM | POA: Diagnosis not present

## 2019-03-03 DIAGNOSIS — R5383 Other fatigue: Secondary | ICD-10-CM | POA: Diagnosis not present

## 2019-03-03 DIAGNOSIS — J301 Allergic rhinitis due to pollen: Secondary | ICD-10-CM | POA: Diagnosis not present

## 2019-03-03 DIAGNOSIS — G4733 Obstructive sleep apnea (adult) (pediatric): Secondary | ICD-10-CM | POA: Diagnosis not present

## 2019-09-04 DIAGNOSIS — Z23 Encounter for immunization: Secondary | ICD-10-CM | POA: Diagnosis not present

## 2019-09-06 IMAGING — CT CT CHEST W/O CM
2 of 4 series · 15 of 36 positions shown, 18 images · non-contrast
Comparison: 03/28/2018

CLINICAL DATA: Followup indeterminate pulmonary nodules.

EXAM:
CT CHEST WITHOUT CONTRAST
TECHNIQUE: Multidetector CT imaging of the chest was performed following the
standard protocol without IV contrast.

[Series 2: thorax · axial · 0.71mm/px · z∈[-418,-136]mm · 12 of 165 slices shown, 15 images]
[im 12/165  mediastinal]
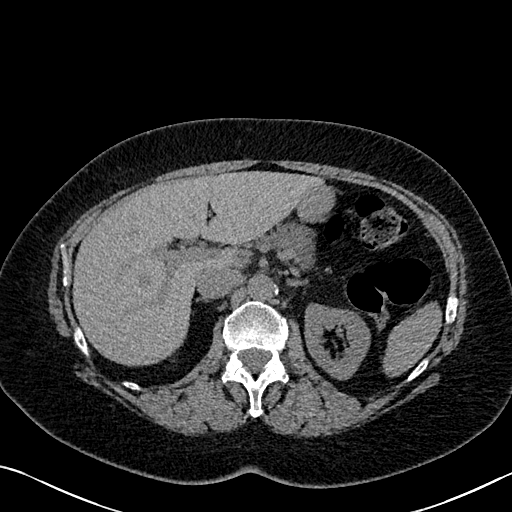
[im 12/165  lung]
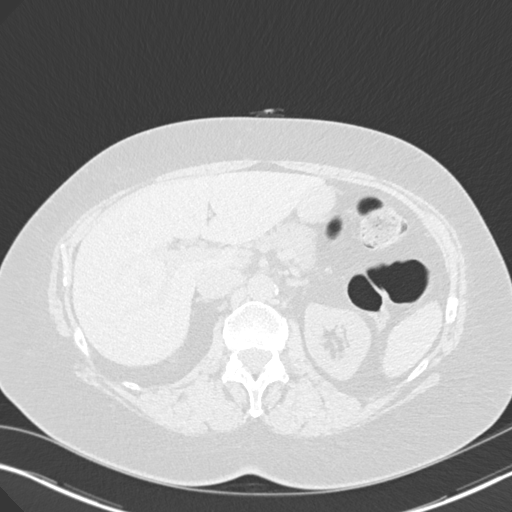
[im 24/165  lung]
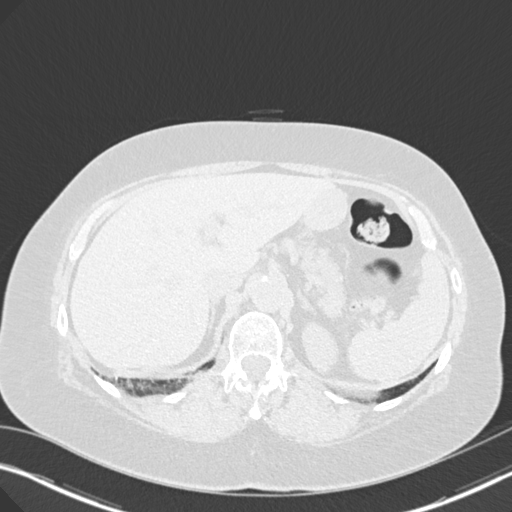
[im 36/165  lung]
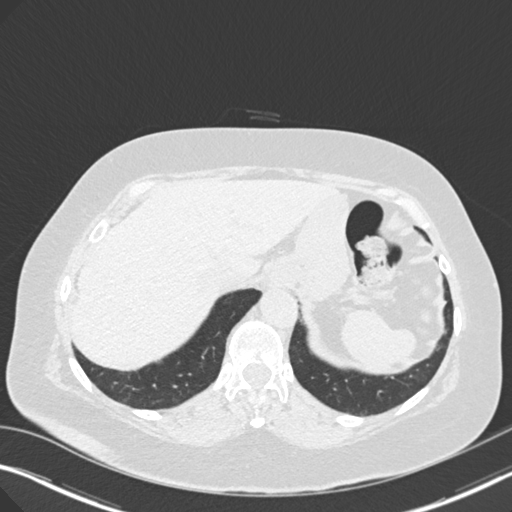
[im 47/165  lung]
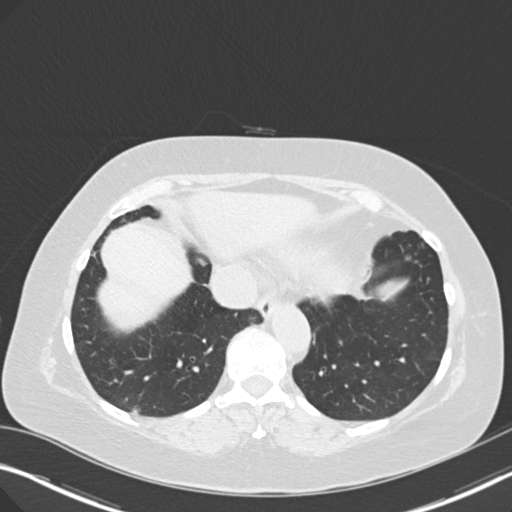
[im 59/165  mediastinal]
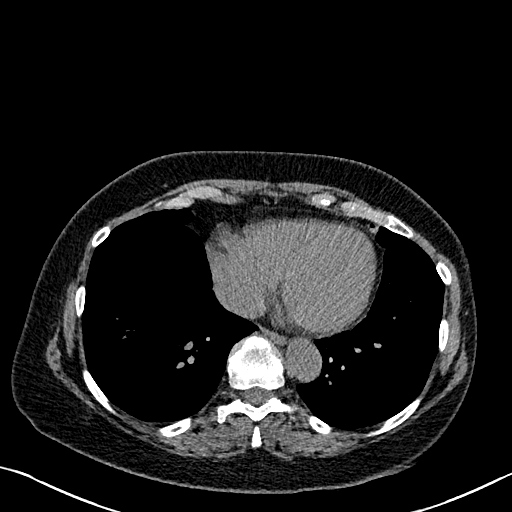
[im 59/165  lung]
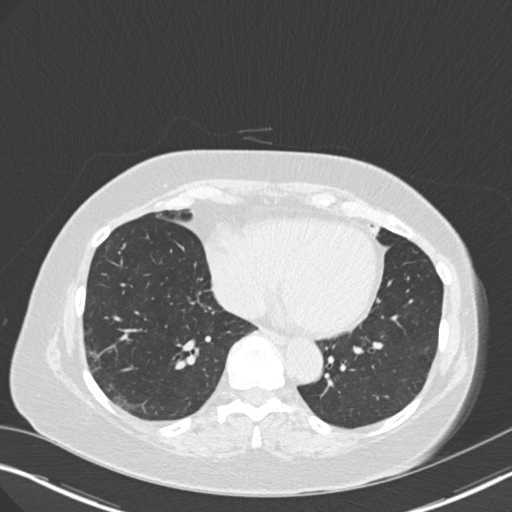
[im 71/165  lung]
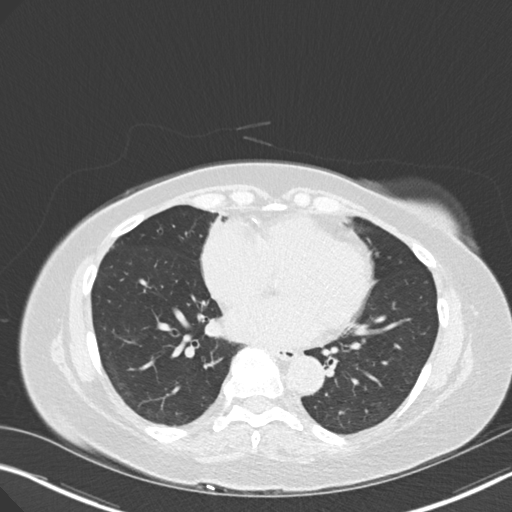
[im 94/165  lung]
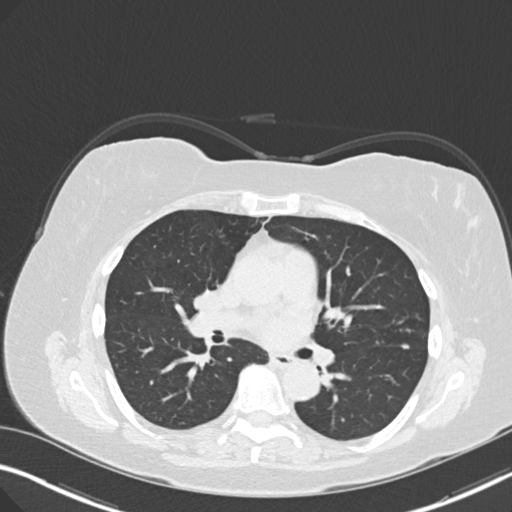
[im 106/165  lung]
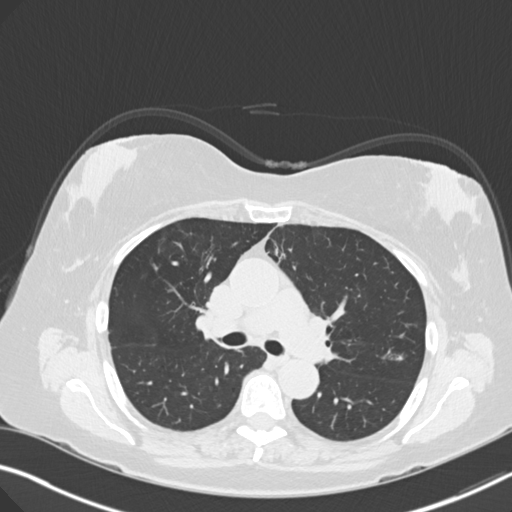
[im 118/165  mediastinal]
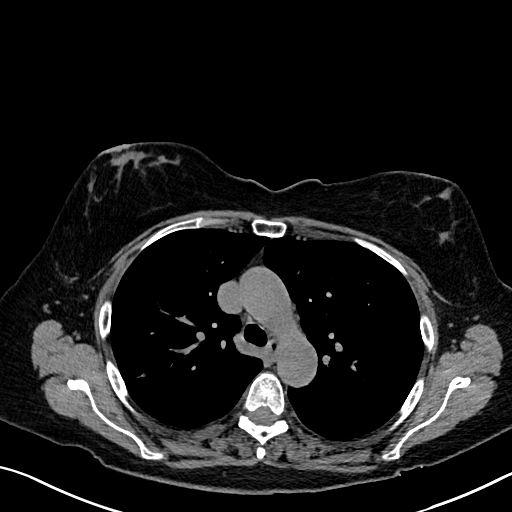
[im 118/165  lung]
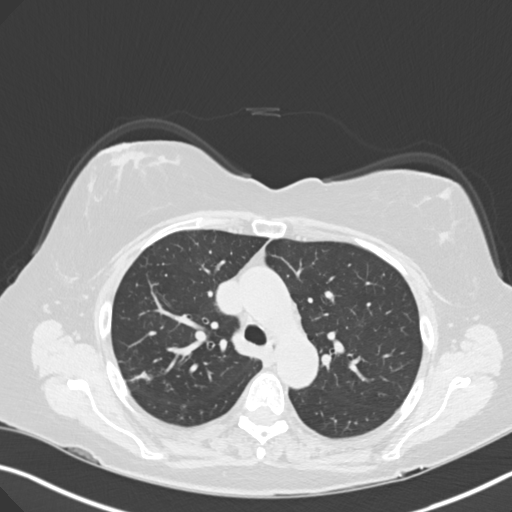
[im 129/165  lung]
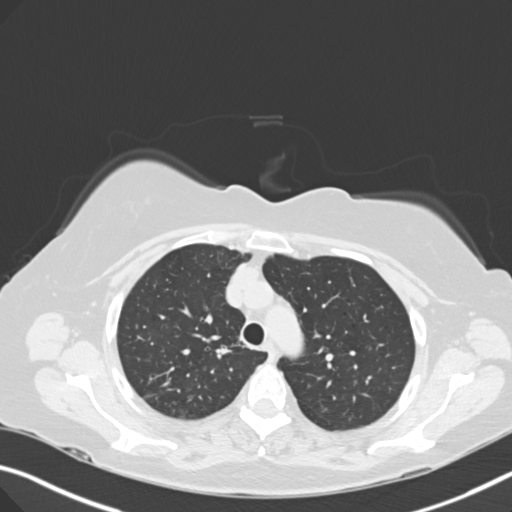
[im 141/165  lung]
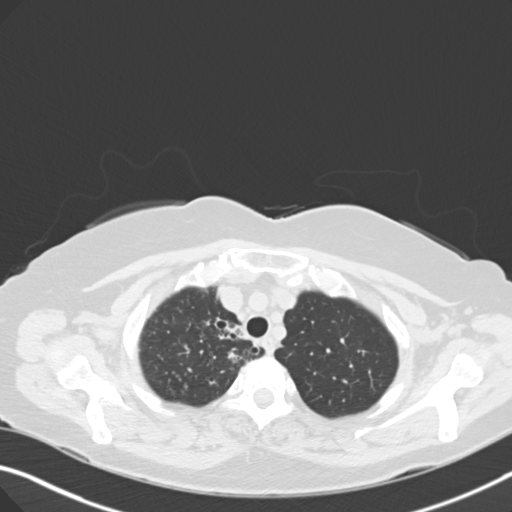
[im 153/165  lung]
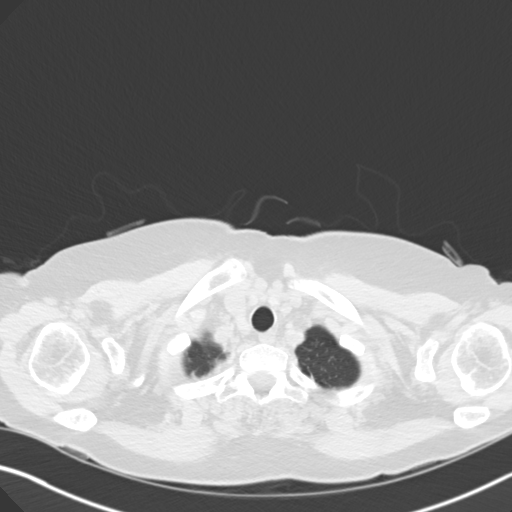

[Series 5: coronal · coronal · 0.63mm/px · 3 of 128 slices shown]
[im 26/128  lung]
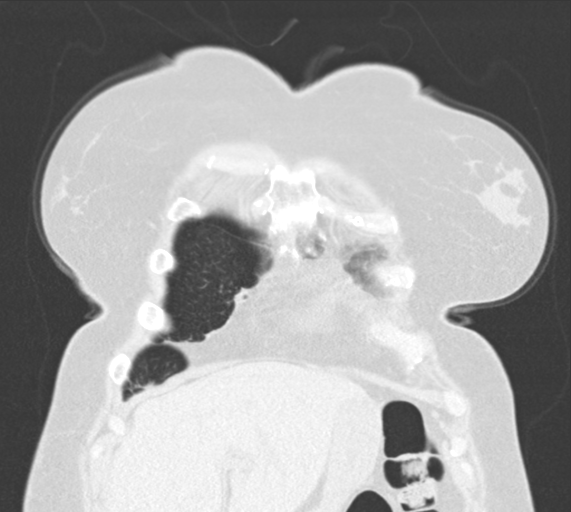
[im 51/128  lung]
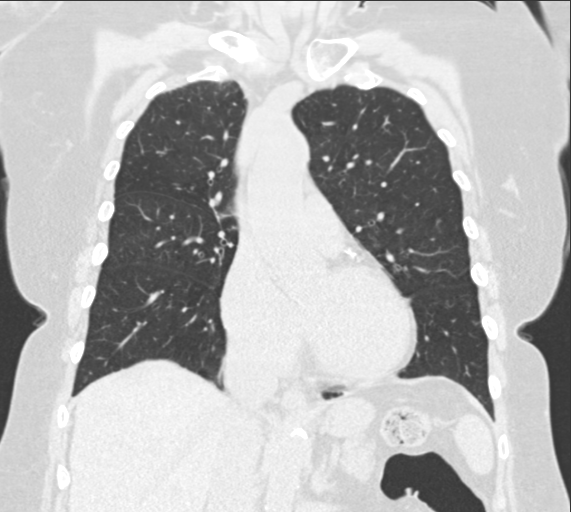
[im 77/128  lung]
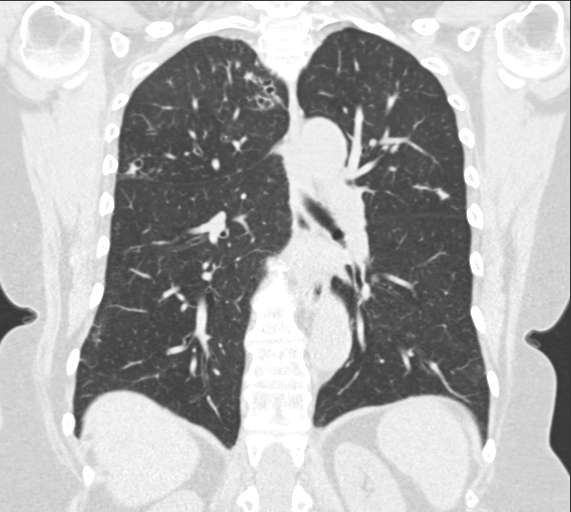

[15 of 36 positions shown; findings below may reference images not displayed]

FINDINGS: Cardiovascular: No acute findings. Aortic and coronary artery
atherosclerosis.

Mediastinum/Nodes: No masses or pathologically enlarged lymph nodes
identified on this unenhanced exam.

Lungs/Pleura: Mild bronchiectasis and scarring again seen in the
right upper lobe. Scattered nodular densities are again seen
bilaterally which are stable or decreased in size since previous
study. Index nodule in the posterior right upper lobe currently
measures 7 x 5 mm on image 57/3, compared to 13.9 mm previously.
Another index nodule in the posterior left upper lobe currently
measures 6 x 6 mm on image 62/3, compared with 11 x 8 mm previously.
These are consistent with a resolving atypical infectious or
inflammatory process such as BATTHAM. No new or enlarging nodules are
identified. No evidence of acute airspace disease or pleural
effusion.

Upper Abdomen:  Unremarkable.

Musculoskeletal:  No suspicious bone lesions.
IMPRESSION: Bilateral pulmonary nodules with many showing interval decrease in
size, consistent with resolving atypical infectious or inflammatory
process such as BATTHAM.

No new or progressive disease within the thorax.

Stable right upper lobe scarring and bronchiectasis.

Aortic Atherosclerosis ([id number].M). Coronary artery calcification.

## 2019-11-16 ENCOUNTER — Other Ambulatory Visit: Payer: Self-pay

## 2020-03-08 DIAGNOSIS — J301 Allergic rhinitis due to pollen: Secondary | ICD-10-CM | POA: Diagnosis not present

## 2020-03-08 DIAGNOSIS — G4733 Obstructive sleep apnea (adult) (pediatric): Secondary | ICD-10-CM | POA: Diagnosis not present

## 2020-03-08 DIAGNOSIS — R5383 Other fatigue: Secondary | ICD-10-CM | POA: Diagnosis not present

## 2020-03-14 DIAGNOSIS — G4733 Obstructive sleep apnea (adult) (pediatric): Secondary | ICD-10-CM | POA: Diagnosis not present

## 2020-03-31 DIAGNOSIS — Z1331 Encounter for screening for depression: Secondary | ICD-10-CM | POA: Diagnosis not present

## 2020-03-31 DIAGNOSIS — M6740 Ganglion, unspecified site: Secondary | ICD-10-CM | POA: Diagnosis not present

## 2020-03-31 DIAGNOSIS — J329 Chronic sinusitis, unspecified: Secondary | ICD-10-CM | POA: Diagnosis not present

## 2020-03-31 DIAGNOSIS — Z7189 Other specified counseling: Secondary | ICD-10-CM | POA: Diagnosis not present

## 2020-03-31 DIAGNOSIS — Z6827 Body mass index (BMI) 27.0-27.9, adult: Secondary | ICD-10-CM | POA: Diagnosis not present

## 2020-06-01 DIAGNOSIS — R040 Epistaxis: Secondary | ICD-10-CM | POA: Diagnosis not present

## 2020-06-23 DIAGNOSIS — R0989 Other specified symptoms and signs involving the circulatory and respiratory systems: Secondary | ICD-10-CM | POA: Diagnosis not present

## 2020-07-20 DIAGNOSIS — R079 Chest pain, unspecified: Secondary | ICD-10-CM | POA: Diagnosis not present

## 2020-07-20 DIAGNOSIS — I491 Atrial premature depolarization: Secondary | ICD-10-CM | POA: Diagnosis not present

## 2020-07-20 DIAGNOSIS — R0902 Hypoxemia: Secondary | ICD-10-CM | POA: Diagnosis not present

## 2020-07-20 DIAGNOSIS — J90 Pleural effusion, not elsewhere classified: Secondary | ICD-10-CM | POA: Diagnosis not present

## 2020-07-20 DIAGNOSIS — I10 Essential (primary) hypertension: Secondary | ICD-10-CM | POA: Diagnosis not present

## 2020-07-20 DIAGNOSIS — R0789 Other chest pain: Secondary | ICD-10-CM | POA: Diagnosis not present

## 2020-07-21 DIAGNOSIS — K219 Gastro-esophageal reflux disease without esophagitis: Secondary | ICD-10-CM | POA: Diagnosis not present

## 2020-07-21 DIAGNOSIS — R911 Solitary pulmonary nodule: Secondary | ICD-10-CM | POA: Diagnosis not present

## 2020-07-21 DIAGNOSIS — Z6828 Body mass index (BMI) 28.0-28.9, adult: Secondary | ICD-10-CM | POA: Diagnosis not present

## 2020-07-21 DIAGNOSIS — R079 Chest pain, unspecified: Secondary | ICD-10-CM | POA: Diagnosis not present

## 2020-07-21 DIAGNOSIS — R0989 Other specified symptoms and signs involving the circulatory and respiratory systems: Secondary | ICD-10-CM | POA: Diagnosis not present

## 2020-07-29 DIAGNOSIS — R0602 Shortness of breath: Secondary | ICD-10-CM | POA: Diagnosis not present

## 2020-07-29 DIAGNOSIS — R079 Chest pain, unspecified: Secondary | ICD-10-CM | POA: Diagnosis not present

## 2020-07-29 DIAGNOSIS — R911 Solitary pulmonary nodule: Secondary | ICD-10-CM | POA: Diagnosis not present

## 2020-07-29 DIAGNOSIS — R0989 Other specified symptoms and signs involving the circulatory and respiratory systems: Secondary | ICD-10-CM | POA: Diagnosis not present

## 2020-08-09 DIAGNOSIS — J479 Bronchiectasis, uncomplicated: Secondary | ICD-10-CM | POA: Diagnosis not present

## 2020-09-01 DIAGNOSIS — D692 Other nonthrombocytopenic purpura: Secondary | ICD-10-CM | POA: Diagnosis not present

## 2020-09-29 DIAGNOSIS — Z23 Encounter for immunization: Secondary | ICD-10-CM | POA: Diagnosis not present

## 2020-10-03 DIAGNOSIS — D692 Other nonthrombocytopenic purpura: Secondary | ICD-10-CM | POA: Diagnosis not present

## 2020-11-24 DIAGNOSIS — Z20828 Contact with and (suspected) exposure to other viral communicable diseases: Secondary | ICD-10-CM | POA: Diagnosis not present

## 2020-11-24 DIAGNOSIS — J209 Acute bronchitis, unspecified: Secondary | ICD-10-CM | POA: Diagnosis not present

## 2021-03-06 DIAGNOSIS — Z0000 Encounter for general adult medical examination without abnormal findings: Secondary | ICD-10-CM | POA: Diagnosis not present

## 2021-03-06 DIAGNOSIS — R197 Diarrhea, unspecified: Secondary | ICD-10-CM | POA: Diagnosis not present

## 2021-03-06 DIAGNOSIS — E785 Hyperlipidemia, unspecified: Secondary | ICD-10-CM | POA: Diagnosis not present

## 2021-03-06 DIAGNOSIS — R1084 Generalized abdominal pain: Secondary | ICD-10-CM | POA: Diagnosis not present

## 2021-03-06 DIAGNOSIS — Z1339 Encounter for screening examination for other mental health and behavioral disorders: Secondary | ICD-10-CM | POA: Diagnosis not present

## 2021-03-06 DIAGNOSIS — Z6825 Body mass index (BMI) 25.0-25.9, adult: Secondary | ICD-10-CM | POA: Diagnosis not present

## 2021-03-06 DIAGNOSIS — Z1331 Encounter for screening for depression: Secondary | ICD-10-CM | POA: Diagnosis not present

## 2021-03-06 DIAGNOSIS — R110 Nausea: Secondary | ICD-10-CM | POA: Diagnosis not present

## 2021-03-06 DIAGNOSIS — J479 Bronchiectasis, uncomplicated: Secondary | ICD-10-CM | POA: Diagnosis not present

## 2021-03-14 DIAGNOSIS — R1084 Generalized abdominal pain: Secondary | ICD-10-CM | POA: Diagnosis not present

## 2021-03-14 DIAGNOSIS — R197 Diarrhea, unspecified: Secondary | ICD-10-CM | POA: Diagnosis not present

## 2021-03-23 DIAGNOSIS — D72828 Other elevated white blood cell count: Secondary | ICD-10-CM | POA: Diagnosis not present

## 2021-04-05 DIAGNOSIS — R229 Localized swelling, mass and lump, unspecified: Secondary | ICD-10-CM | POA: Diagnosis not present

## 2021-04-12 DIAGNOSIS — K591 Functional diarrhea: Secondary | ICD-10-CM | POA: Diagnosis not present

## 2021-04-12 DIAGNOSIS — Z6825 Body mass index (BMI) 25.0-25.9, adult: Secondary | ICD-10-CM | POA: Diagnosis not present

## 2021-04-12 DIAGNOSIS — R42 Dizziness and giddiness: Secondary | ICD-10-CM | POA: Diagnosis not present

## 2021-04-12 DIAGNOSIS — J479 Bronchiectasis, uncomplicated: Secondary | ICD-10-CM | POA: Diagnosis not present

## 2021-04-12 DIAGNOSIS — R2231 Localized swelling, mass and lump, right upper limb: Secondary | ICD-10-CM | POA: Diagnosis not present

## 2021-04-28 DIAGNOSIS — N6331 Unspecified lump in axillary tail of the right breast: Secondary | ICD-10-CM | POA: Diagnosis not present

## 2021-04-28 DIAGNOSIS — R922 Inconclusive mammogram: Secondary | ICD-10-CM | POA: Diagnosis not present

## 2021-04-28 DIAGNOSIS — R2231 Localized swelling, mass and lump, right upper limb: Secondary | ICD-10-CM | POA: Diagnosis not present

## 2021-05-08 DIAGNOSIS — R195 Other fecal abnormalities: Secondary | ICD-10-CM | POA: Diagnosis not present

## 2021-05-08 DIAGNOSIS — R197 Diarrhea, unspecified: Secondary | ICD-10-CM | POA: Diagnosis not present

## 2021-05-12 DIAGNOSIS — K5730 Diverticulosis of large intestine without perforation or abscess without bleeding: Secondary | ICD-10-CM | POA: Diagnosis not present

## 2021-05-12 DIAGNOSIS — Z1211 Encounter for screening for malignant neoplasm of colon: Secondary | ICD-10-CM | POA: Diagnosis not present

## 2021-07-03 DIAGNOSIS — L578 Other skin changes due to chronic exposure to nonionizing radiation: Secondary | ICD-10-CM | POA: Diagnosis not present

## 2021-07-03 DIAGNOSIS — Z86018 Personal history of other benign neoplasm: Secondary | ICD-10-CM | POA: Diagnosis not present

## 2021-07-03 DIAGNOSIS — Z20822 Contact with and (suspected) exposure to covid-19: Secondary | ICD-10-CM | POA: Diagnosis not present

## 2021-07-03 DIAGNOSIS — Z87898 Personal history of other specified conditions: Secondary | ICD-10-CM | POA: Diagnosis not present

## 2021-07-03 DIAGNOSIS — L814 Other melanin hyperpigmentation: Secondary | ICD-10-CM | POA: Diagnosis not present

## 2021-07-03 DIAGNOSIS — L821 Other seborrheic keratosis: Secondary | ICD-10-CM | POA: Diagnosis not present

## 2021-07-03 DIAGNOSIS — D225 Melanocytic nevi of trunk: Secondary | ICD-10-CM | POA: Diagnosis not present

## 2021-10-25 DIAGNOSIS — Z23 Encounter for immunization: Secondary | ICD-10-CM | POA: Diagnosis not present

## 2021-12-07 DIAGNOSIS — B9789 Other viral agents as the cause of diseases classified elsewhere: Secondary | ICD-10-CM | POA: Diagnosis not present

## 2021-12-07 DIAGNOSIS — R700 Elevated erythrocyte sedimentation rate: Secondary | ICD-10-CM | POA: Diagnosis not present

## 2021-12-07 DIAGNOSIS — Z20828 Contact with and (suspected) exposure to other viral communicable diseases: Secondary | ICD-10-CM | POA: Diagnosis not present

## 2021-12-07 DIAGNOSIS — R0981 Nasal congestion: Secondary | ICD-10-CM | POA: Diagnosis not present

## 2022-01-06 DIAGNOSIS — Z20822 Contact with and (suspected) exposure to covid-19: Secondary | ICD-10-CM | POA: Diagnosis not present

## 2022-01-10 DIAGNOSIS — Z86018 Personal history of other benign neoplasm: Secondary | ICD-10-CM | POA: Diagnosis not present

## 2022-01-10 DIAGNOSIS — L814 Other melanin hyperpigmentation: Secondary | ICD-10-CM | POA: Diagnosis not present

## 2022-01-10 DIAGNOSIS — Z87898 Personal history of other specified conditions: Secondary | ICD-10-CM | POA: Diagnosis not present

## 2022-01-10 DIAGNOSIS — Z23 Encounter for immunization: Secondary | ICD-10-CM | POA: Diagnosis not present

## 2022-01-10 DIAGNOSIS — L578 Other skin changes due to chronic exposure to nonionizing radiation: Secondary | ICD-10-CM | POA: Diagnosis not present

## 2022-01-10 DIAGNOSIS — D225 Melanocytic nevi of trunk: Secondary | ICD-10-CM | POA: Diagnosis not present

## 2022-01-10 DIAGNOSIS — L821 Other seborrheic keratosis: Secondary | ICD-10-CM | POA: Diagnosis not present

## 2022-03-13 DIAGNOSIS — Z20828 Contact with and (suspected) exposure to other viral communicable diseases: Secondary | ICD-10-CM | POA: Diagnosis not present

## 2022-03-13 DIAGNOSIS — R509 Fever, unspecified: Secondary | ICD-10-CM | POA: Diagnosis not present

## 2022-03-13 DIAGNOSIS — R519 Headache, unspecified: Secondary | ICD-10-CM | POA: Diagnosis not present

## 2022-03-13 DIAGNOSIS — M791 Myalgia, unspecified site: Secondary | ICD-10-CM | POA: Diagnosis not present

## 2022-03-13 DIAGNOSIS — R051 Acute cough: Secondary | ICD-10-CM | POA: Diagnosis not present

## 2022-04-30 DIAGNOSIS — Z20822 Contact with and (suspected) exposure to covid-19: Secondary | ICD-10-CM | POA: Diagnosis not present

## 2022-05-17 DIAGNOSIS — B351 Tinea unguium: Secondary | ICD-10-CM | POA: Insufficient documentation

## 2022-05-17 DIAGNOSIS — L600 Ingrowing nail: Secondary | ICD-10-CM | POA: Insufficient documentation

## 2022-05-17 HISTORY — DX: Ingrowing nail: L60.0

## 2022-05-17 HISTORY — DX: Tinea unguium: B35.1

## 2022-05-24 DIAGNOSIS — S51812A Laceration without foreign body of left forearm, initial encounter: Secondary | ICD-10-CM | POA: Diagnosis not present

## 2022-05-29 ENCOUNTER — Ambulatory Visit: Payer: Medicare Other | Admitting: Podiatrist

## 2022-06-22 DIAGNOSIS — R051 Acute cough: Secondary | ICD-10-CM | POA: Diagnosis not present

## 2022-06-22 DIAGNOSIS — J069 Acute upper respiratory infection, unspecified: Secondary | ICD-10-CM | POA: Diagnosis not present

## 2022-07-18 DIAGNOSIS — R040 Epistaxis: Secondary | ICD-10-CM | POA: Diagnosis not present

## 2022-07-19 DIAGNOSIS — Z6821 Body mass index (BMI) 21.0-21.9, adult: Secondary | ICD-10-CM | POA: Diagnosis not present

## 2022-07-19 DIAGNOSIS — H1031 Unspecified acute conjunctivitis, right eye: Secondary | ICD-10-CM | POA: Diagnosis not present

## 2022-07-19 DIAGNOSIS — R008 Other abnormalities of heart beat: Secondary | ICD-10-CM | POA: Diagnosis not present

## 2022-07-19 DIAGNOSIS — I493 Ventricular premature depolarization: Secondary | ICD-10-CM | POA: Diagnosis not present

## 2022-07-24 ENCOUNTER — Other Ambulatory Visit: Payer: Self-pay

## 2022-07-24 ENCOUNTER — Ambulatory Visit (INDEPENDENT_AMBULATORY_CARE_PROVIDER_SITE_OTHER): Payer: Medicare Other | Admitting: Cardiology

## 2022-07-24 ENCOUNTER — Ambulatory Visit (INDEPENDENT_AMBULATORY_CARE_PROVIDER_SITE_OTHER): Payer: Medicare Other

## 2022-07-24 VITALS — BP 122/54 | HR 79 | Ht 64.0 in | Wt 156.6 lb

## 2022-07-24 DIAGNOSIS — Z6821 Body mass index (BMI) 21.0-21.9, adult: Secondary | ICD-10-CM | POA: Insufficient documentation

## 2022-07-24 DIAGNOSIS — I493 Ventricular premature depolarization: Secondary | ICD-10-CM | POA: Diagnosis not present

## 2022-07-24 DIAGNOSIS — J479 Bronchiectasis, uncomplicated: Secondary | ICD-10-CM | POA: Insufficient documentation

## 2022-07-24 DIAGNOSIS — R011 Cardiac murmur, unspecified: Secondary | ICD-10-CM | POA: Insufficient documentation

## 2022-07-24 DIAGNOSIS — C4362 Malignant melanoma of left upper limb, including shoulder: Secondary | ICD-10-CM | POA: Insufficient documentation

## 2022-07-24 DIAGNOSIS — R609 Edema, unspecified: Secondary | ICD-10-CM | POA: Insufficient documentation

## 2022-07-24 DIAGNOSIS — R002 Palpitations: Secondary | ICD-10-CM

## 2022-07-24 DIAGNOSIS — E785 Hyperlipidemia, unspecified: Secondary | ICD-10-CM | POA: Insufficient documentation

## 2022-07-24 DIAGNOSIS — M21611 Bunion of right foot: Secondary | ICD-10-CM | POA: Insufficient documentation

## 2022-07-24 DIAGNOSIS — C801 Malignant (primary) neoplasm, unspecified: Secondary | ICD-10-CM | POA: Insufficient documentation

## 2022-07-24 DIAGNOSIS — R008 Other abnormalities of heart beat: Secondary | ICD-10-CM | POA: Insufficient documentation

## 2022-07-24 DIAGNOSIS — G4733 Obstructive sleep apnea (adult) (pediatric): Secondary | ICD-10-CM | POA: Insufficient documentation

## 2022-07-24 DIAGNOSIS — R918 Other nonspecific abnormal finding of lung field: Secondary | ICD-10-CM | POA: Insufficient documentation

## 2022-07-24 HISTORY — DX: Cardiac murmur, unspecified: R01.1

## 2022-07-24 HISTORY — DX: Palpitations: R00.2

## 2022-07-24 NOTE — Patient Instructions (Signed)
Medication Instructions:  Your physician recommends that you continue on your current medications as directed. Please refer to the Current Medication list given to you today.  *If you need a refill on your cardiac medications before your next appointment, please call your pharmacy*   Lab Work: None ordered If you have labs (blood work) drawn today and your tests are completely normal, you will receive your results only by: [facility] (if you have MyChart) OR A paper copy in the mail If you have any lab test that is abnormal or we need to change your treatment, we will call you to review the results.   Testing/Procedures: Your physician has requested that you have an echocardiogram. Echocardiography is a painless test that uses sound waves to create images of your heart. It provides your doctor with information about the size and shape of your heart and how well your heart's chambers and valves are working. This procedure takes approximately one hour. There are no restrictions for this procedure.   WHY IS MY DOCTOR PRESCRIBING ZIO? The Zio system is proven and trusted by physicians to detect and diagnose irregular heart rhythms -- and has been prescribed to hundreds of thousands of patients.  The FDA has cleared the Zio system to monitor for many different kinds of irregular heart rhythms. In a study, physicians were able to reach a diagnosis 90% of the time with the Zio system1.  You can wear the Zio monitor -- a small, discreet, comfortable patch -- during your normal day-to-day activity, including while you sleep, shower, and exercise, while it records every single heartbeat for analysis.  1Barrett, P., et al. Comparison of 24 Hour Holter Monitoring Versus 14 Day Novel Adhesive Patch Electrocardiographic Monitoring. [place], 2014.  ZIO VS. HOLTER MONITORING The Zio monitor can be comfortably worn for up to 14 days. Holter monitors can be worn for 24 to 48  hours, limiting the time to record any irregular heart rhythms you may have. Zio is able to capture data for the 51% of patients who have their first symptom-triggered arrhythmia after 48 hours.1  LIVE WITHOUT RESTRICTIONS The Zio ambulatory cardiac monitor is a small, unobtrusive, and water-resistant patch--you might even forget you're wearing it. The Zio monitor records and stores every beat of your heart, whether you're sleeping, working out, or showering. Wear the monitor for 14 days, remove 08/07/22.    Follow-Up: At [facility], you and your health needs are our priority.  As part of our continuing mission to provide you with exceptional heart care, we have created designated Provider Care Teams.  These Care Teams include your primary Cardiologist (physician) and Advanced Practice Providers (APPs -  Physician Assistants and Nurse Practitioners) who all work together to provide you with the care you need, when you need it.  We recommend signing up for the patient portal called "MyChart".  Sign up information is provided on this After Visit Summary.  MyChart is used to connect with patients for Virtual Visits (Telemedicine).  Patients are able to view lab/test results, encounter notes, upcoming appointments, etc.  Non-urgent messages can be sent to your provider as well.   To learn more about what you can do with MyChart, go to [email or web address].    Your next appointment:   9 month(s)  The format for your next appointment:   In Person  Provider:   Kirk Ruths, MD or Jyl Heinz, MD   Other Instructions Echocardiogram An echocardiogram is a test that uses sound  waves (ultrasound) to produce images of the heart. Images from an echocardiogram can provide important information about: Heart size and shape. The size and thickness and movement of your heart's walls. Heart muscle function and strength. Heart valve function or if you have stenosis. Stenosis is when the heart  valves are too narrow. If blood is flowing backward through the heart valves (regurgitation). A tumor or infectious growth around the heart valves. Areas of heart muscle that are not working well because of poor blood flow or injury from a heart attack. Aneurysm detection. An aneurysm is a weak or damaged part of an artery wall. The wall bulges out from the normal force of blood pumping through the body. Tell a health care provider about: Any allergies you have. All medicines you are taking, including vitamins, herbs, eye drops, creams, and over-the-counter medicines. Any blood disorders you have. Any surgeries you have had. Any medical conditions you have. Whether you are pregnant or may be pregnant. What are the risks? Generally, this is a safe test. However, problems may occur, including an allergic reaction to dye (contrast) that may be used during the test. What happens before the test? No specific preparation is needed. You may eat and drink normally. What happens during the test? You will take off your clothes from the waist up and put on a hospital gown. Electrodes or electrocardiogram (ECG)patches may be placed on your chest. The electrodes or patches are then connected to a device that monitors your heart rate and rhythm. You will lie down on a table for an ultrasound exam. A gel will be applied to your chest to help sound waves pass through your skin. A handheld device, called a transducer, will be pressed against your chest and moved over your heart. The transducer produces sound waves that travel to your heart and bounce back (or "echo" back) to the transducer. These sound waves will be captured in real-time and changed into images of your heart that can be viewed on a video monitor. The images will be recorded on a computer and reviewed by your health care provider. You may be asked to change positions or hold your breath for a short time. This makes it easier to get different  views or better views of your heart. In some cases, you may receive contrast through an IV in one of your veins. This can improve the quality of the pictures from your heart. The procedure may vary among health care providers and hospitals.   What can I expect after the test? You may return to your normal, everyday life, including diet, activities, and medicines, unless your health care provider tells you not to do that. Follow these instructions at home: It is up to you to get the results of your test. Ask your health care provider, or the department that is doing the test, when your results will be ready. Keep all follow-up visits. This is important. Summary An echocardiogram is a test that uses sound waves (ultrasound) to produce images of the heart. Images from an echocardiogram can provide important information about the size and shape of your heart, heart muscle function, heart valve function, and other possible heart problems. You do not need to do anything to prepare before this test. You may eat and drink normally. After the echocardiogram is completed, you may return to your normal, everyday life, unless your health care provider tells you not to do that. This information is not intended to replace advice given to you  by your health care provider. Make sure you discuss any questions you have with your health care provider. Document Revised: 08/02/2020 Document Reviewed: 08/02/2020 Elsevier Patient Education  2021 [employer].

## 2022-07-24 NOTE — Progress Notes (Unsigned)
Cardiology Office Note:    Date:  07/24/2022   ID:  Leah Steele, DOB 1946-09-30, MRN [id number]  PCP:  Ernestene Kiel, MD  Cardiologist:  Jenean Lindau, MD   Referring MD: Ernestene Kiel, MD    ASSESSMENT:    1. PVC (premature ventricular contraction)   2. Palpitations   3. Cardiac murmur    PLAN:    In order of problems listed above:  Primary prevention stressed with the patient.  Importance of compliance with diet and medication stressed and she vocalized understanding.  She was advised to walk at least 30 minutes a day 5 days a week and she promises to do so. Palpitations: She will have a 2-week monitor to assess this. Cardiac murmur: Echocardiogram will be done to assess murmur heard on auscultation. Frequent PVCs: The monitor may help me assess PVC burden and advise her accordingly. Patient had multiple questions which were answered to her satisfaction.Patient will be seen in follow-up appointment in 6 months or earlier if the patient has any concerns    Medication Adjustments/Labs and Tests Ordered: Current medicines are reviewed at length with the patient today.  Concerns regarding medicines are outlined above.  No orders of the defined types were placed in this encounter.  No orders of the defined types were placed in this encounter.    History of Present Illness:    Leah Steele is a 76 y.o. female who is being seen today for the evaluation of palpitations and frequent PVCs at the request of Ernestene Kiel, MD. patient is a pleasant 76 year old female.  She is a healthy lady.  She denies any history of hypertension dyslipidemia or diabetes mellitus.  She has history of obstructive sleep apnea.  She gives history of palpitations and PVCs.  No orthopnea or PND.  She is an active lady and with activity she has no symptoms.  For this reason she was sent here.  No dizziness or passing out spells.  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   Hallux valgus, acquired 07/15/2017   History of malignant melanoma of skin 10/08/2013   Hyperlipidemia    Ingrown nail 05/17/2022   Melanocytic nevus 11/13/2013   Melanoma of upper arm, left ([place])    Metatarsalgia of both feet 12/30/2017   OSA (obstructive sleep apnea)    Pre-ulcerative calluses 12/30/2017   Pulmonary nodules    PVC (premature ventricular contraction)    Swelling    Toenail fungus 05/17/2022   Trigeminy     Past Surgical History:  Procedure Laterality Date   FOOT SURGERY     melanoma surgery     melanoma excision with extensive facial plastic surgery    Current Medications: Current Meds  Medication Sig   Ascorbic Acid (VITAMIN C) 1000 MG tablet Take 1,000 mg by mouth daily.   Biotin 10 MG TABS Take 10 mg by mouth daily.   calcium carbonate (OSCAL) 1500 (600 Ca) MG TABS tablet Take 1,500 mg by mouth daily.   Cholecalciferol 125 MCG (5000 UT) TABS Take 10,000 Units by mouth daily.   ECHINACEA PO Take 400 mg by mouth daily.   GINKGO BILOBA PO Take 1 tablet by mouth in the morning and at  bedtime.   Multiple Vitamin (MULTIVITAMIN PO) Take 1 tablet by mouth daily.   Omega-3 Fatty Acids (FISH OIL) 1000 MG CAPS Take 1,000 mg by mouth daily.     Allergies:   Patient has no known allergies.   Social History   Socioeconomic History   Marital status: Single    Spouse name: Not on file   Number of children: Not on file   Years of education: Not on file   Highest education level: Not on file  Occupational History   Not on file  Tobacco Use   Smoking status: Former    Packs/day: 4.00    Years: 20.00    Total pack years: 80.00    Types: Cigarettes    Quit date: 62    Years since quitting: 25.5   Smokeless tobacco: Never   Vaping Use   Vaping Use: Never used  Substance and Sexual Activity   Alcohol use: Yes    Alcohol/week: 1.0 standard drink of alcohol    Types: 1 Glasses of wine per week   Drug use: No   Sexual activity: Not on file  Other Topics Concern   Not on file  Social History Narrative   Not on file   Social Determinants of Health   Financial Resource Strain: Not on file  Food Insecurity: Not on file  Transportation Needs: Not on file  Physical Activity: Not on file  Stress: Not on file  Social Connections: Not on file     Family History: The patient's family history includes Breast cancer in her paternal grandmother; Cancer in her sister.  ROS:   Please see the history of present illness.    All other systems reviewed and are negative.  EKGs/Labs/Other Studies Reviewed:    The following studies were reviewed today: EKG reveals sinus rhythm and ectopy.   Recent Labs: No results found for requested labs within last 365 days.  Recent Lipid Panel No results found for: "CHOL", "TRIG", "HDL", "CHOLHDL", "VLDL", "LDLCALC", "LDLDIRECT"  Physical Exam:    VS:  BP (!) 122/54   Pulse 79   Ht '5\' 4"'$  (1.626 m)   Wt 156 lb 9.6 oz (71 kg)   SpO2 94%   BMI 26.88 kg/m     Wt Readings from Last 3 Encounters:  07/24/22 156 lb 9.6 oz (71 kg)  07/22/18 173 lb (78.5 kg)  05/08/18 170 lb 6.4 oz (77.3 kg)     GEN: Patient is in no acute distress HEENT: Normal NECK: No JVD; No carotid bruits LYMPHATICS: No lymphadenopathy CARDIAC: S1 S2 regular, 2/6 systolic murmur at the apex. RESPIRATORY:  Clear to auscultation without rales, wheezing or rhonchi  ABDOMEN: Soft, non-tender, non-distended MUSCULOSKELETAL:  No edema; No deformity  SKIN: Warm and dry NEUROLOGIC:  Alert and oriented x 3 PSYCHIATRIC:  Normal affect    Signed, Jenean Lindau, MD  07/24/2022 3:59 PM    [facility]

## 2022-07-25 DIAGNOSIS — Z87898 Personal history of other specified conditions: Secondary | ICD-10-CM | POA: Diagnosis not present

## 2022-07-25 DIAGNOSIS — L578 Other skin changes due to chronic exposure to nonionizing radiation: Secondary | ICD-10-CM | POA: Diagnosis not present

## 2022-07-25 DIAGNOSIS — Z86018 Personal history of other benign neoplasm: Secondary | ICD-10-CM | POA: Diagnosis not present

## 2022-07-25 DIAGNOSIS — L814 Other melanin hyperpigmentation: Secondary | ICD-10-CM | POA: Diagnosis not present

## 2022-07-25 DIAGNOSIS — L821 Other seborrheic keratosis: Secondary | ICD-10-CM | POA: Diagnosis not present

## 2022-07-25 DIAGNOSIS — D225 Melanocytic nevi of trunk: Secondary | ICD-10-CM | POA: Diagnosis not present

## 2022-07-25 DIAGNOSIS — L570 Actinic keratosis: Secondary | ICD-10-CM | POA: Diagnosis not present

## 2022-07-27 ENCOUNTER — Ambulatory Visit (INDEPENDENT_AMBULATORY_CARE_PROVIDER_SITE_OTHER): Payer: Medicare Other

## 2022-07-27 DIAGNOSIS — R011 Cardiac murmur, unspecified: Secondary | ICD-10-CM | POA: Diagnosis not present

## 2022-07-27 LAB — ECHOCARDIOGRAM COMPLETE
Area-P 1/2: 3.99 cm2
P 1/2 time: 499 msec
S' Lateral: 3.2 cm

## 2022-07-30 ENCOUNTER — Telehealth: Payer: Self-pay

## 2022-07-30 ENCOUNTER — Telehealth: Payer: Self-pay | Admitting: Cardiology

## 2022-07-30 NOTE — Telephone Encounter (Signed)
Results reviewed with pt as per Dr. Revankar's note.  Pt verbalized understanding and had no additional questions. Routed to PCP.  

## 2022-07-30 NOTE — Telephone Encounter (Signed)
Patient is calling requesting her Echo results. Please advise.

## 2022-08-14 DIAGNOSIS — I493 Ventricular premature depolarization: Secondary | ICD-10-CM | POA: Diagnosis not present

## 2022-08-14 DIAGNOSIS — R002 Palpitations: Secondary | ICD-10-CM | POA: Diagnosis not present

## 2022-08-20 ENCOUNTER — Telehealth: Payer: Self-pay | Admitting: Cardiology

## 2022-08-20 DIAGNOSIS — I493 Ventricular premature depolarization: Secondary | ICD-10-CM

## 2022-08-20 DIAGNOSIS — R002 Palpitations: Secondary | ICD-10-CM

## 2022-08-20 DIAGNOSIS — I498 Other specified cardiac arrhythmias: Secondary | ICD-10-CM

## 2022-08-20 DIAGNOSIS — R008 Other abnormalities of heart beat: Secondary | ICD-10-CM

## 2022-08-20 MED ORDER — METOPROLOL SUCCINATE ER 25 MG PO TB24: 12.5000 mg | ORAL_TABLET | Freq: Every day | ORAL | 3 refills | Status: DC

## 2022-08-20 NOTE — Addendum Note (Signed)
Addended by: Truddie Hidden on: 08/20/2022 05:37 PM   Modules accepted: Orders

## 2022-08-20 NOTE — Telephone Encounter (Signed)
Patient calling in about her heart monitor results. Please advise

## 2022-08-20 NOTE — Telephone Encounter (Signed)
Results reviewed with pt as per Dr. Julien Nordmann note.  Pt verbalized understanding and had no additional questions. Routed to PCP.  Lexiscan ordered Labs ordered  Reviewed lexiscan instructions and sent via MyChart.

## 2022-08-29 ENCOUNTER — Telehealth (HOSPITAL_COMMUNITY): Payer: Self-pay | Admitting: *Deleted

## 2022-08-29 NOTE — Telephone Encounter (Signed)
Left message on voicemail per DPR in reference to upcoming appointment scheduled on 08/30/2022 at 11:30 with detailed instructions given per Myocardial Perfusion Study Information Sheet for the test. LM to arrive 15 minutes early, and that it is imperative to arrive on time for appointment to keep from having the test rescheduled. If you need to cancel or reschedule your appointment, please call the office within 24 hours of your appointment. Failure to do so may result in a cancellation of your appointment, and a $50 no show fee. Phone number given for call back for any questions.

## 2022-08-30 ENCOUNTER — Telehealth: Payer: Self-pay

## 2022-08-30 ENCOUNTER — Ambulatory Visit: Payer: Medicare Other | Attending: Cardiology

## 2022-08-30 DIAGNOSIS — R008 Other abnormalities of heart beat: Secondary | ICD-10-CM | POA: Diagnosis not present

## 2022-08-30 DIAGNOSIS — I471 Supraventricular tachycardia: Secondary | ICD-10-CM

## 2022-08-30 DIAGNOSIS — I495 Sick sinus syndrome: Secondary | ICD-10-CM | POA: Insufficient documentation

## 2022-08-30 DIAGNOSIS — I493 Ventricular premature depolarization: Secondary | ICD-10-CM | POA: Insufficient documentation

## 2022-08-30 DIAGNOSIS — R002 Palpitations: Secondary | ICD-10-CM | POA: Diagnosis not present

## 2022-08-30 DIAGNOSIS — I498 Other specified cardiac arrhythmias: Secondary | ICD-10-CM | POA: Diagnosis not present

## 2022-08-30 MED ORDER — METOPROLOL TARTRATE 5 MG/5ML IV SOLN: 2.5000 mg | INTRAVENOUS | Status: DC | PRN

## 2022-08-30 MED ORDER — TECHNETIUM TC 99M TETROFOSMIN IV KIT
10.1000 | PACK | Freq: Once | INTRAVENOUS | Status: AC | PRN
  Administered 2022-08-30: 10.1 via INTRAVENOUS

## 2022-08-30 NOTE — Telephone Encounter (Signed)
Pt came in for her lexiscan today but has not taken her Toprol XL. IV Toprol XL 2.5 mg given at 1318 and additional 2.5 mg given at 1325. HR 131 and BP 142/105 at 2nd dose of medication. Pt denies any sx of chest pain, shortness of breath or dizziness.

## 2022-09-04 ENCOUNTER — Ambulatory Visit: Payer: Medicare Other | Attending: Cardiology

## 2022-09-04 LAB — MYOCARDIAL PERFUSION IMAGING
LV dias vol: 64 mL (ref 46–106)
LV sys vol: 29 mL
Nuc Stress EF: 55 %
Peak HR: 82 {beats}/min
Rest HR: 63 {beats}/min
Rest Nuclear Isotope Dose: 10.1 mCi
SDS: 2
SRS: 11
SSS: 6
Stress Nuclear Isotope Dose: 29.3 mCi
TID: 1.23

## 2022-09-04 MED ORDER — TECHNETIUM TC 99M TETROFOSMIN IV KIT
29.3000 | PACK | Freq: Once | INTRAVENOUS | Status: AC | PRN
  Administered 2022-09-04: 29.3 via INTRAVENOUS

## 2022-09-04 MED ORDER — REGADENOSON 0.4 MG/5ML IV SOLN
0.4000 mg | Freq: Once | INTRAVENOUS | Status: AC
  Administered 2022-09-04: 0.4 mg via INTRAVENOUS

## 2022-09-04 MED ORDER — AMINOPHYLLINE 25 MG/ML IV SOLN
75.0000 mg | Freq: Once | INTRAVENOUS | Status: AC
  Administered 2022-09-04: 75 mg via INTRAVENOUS

## 2022-09-19 DIAGNOSIS — Z6826 Body mass index (BMI) 26.0-26.9, adult: Secondary | ICD-10-CM | POA: Diagnosis not present

## 2022-09-19 DIAGNOSIS — D692 Other nonthrombocytopenic purpura: Secondary | ICD-10-CM | POA: Diagnosis not present

## 2022-09-19 DIAGNOSIS — R062 Wheezing: Secondary | ICD-10-CM | POA: Diagnosis not present

## 2022-09-19 DIAGNOSIS — I493 Ventricular premature depolarization: Secondary | ICD-10-CM | POA: Diagnosis not present

## 2022-09-19 DIAGNOSIS — I700 Atherosclerosis of aorta: Secondary | ICD-10-CM | POA: Diagnosis not present

## 2022-10-04 ENCOUNTER — Telehealth: Payer: Self-pay | Admitting: Cardiology

## 2022-10-04 NOTE — Telephone Encounter (Signed)
Patient states she felt lightheaded yesterday and her heart felt like it was racing. She doesn't have a way to check her heart rate at home. She says she was unable to sleep last night and took a metoprolol at 2:00 am and it did help. She says she usually takes the metoprolol mid morning. She says she has not had any symptoms since.

## 2022-10-04 NOTE — Telephone Encounter (Signed)
STAT if patient feels like he/she is going to faint   Are you dizzy now? no  Do you feel faint or have you passed out? Last night  Do you have any other symptoms? Fast heart beating but does not have a way to check her HR  Have you checked your HR and BP (record if available)? no   Patient states she left lightheaded yesterday and her heart started racing. She says she was unable to sleep and took a metoprolol at 2:00 am and it did help. She says she usually takes the metoprolol mid morning. She says she has not had any symptoms since.

## 2022-10-12 NOTE — Telephone Encounter (Signed)
Spoke with pt who states that she had the issue with her heart rate one time. [facility] mobile information has been sent to pt via MyChart. Pt verbalized understanding and had no additional questions.

## 2022-10-25 DIAGNOSIS — Z23 Encounter for immunization: Secondary | ICD-10-CM | POA: Diagnosis not present

## 2022-11-17 DIAGNOSIS — J111 Influenza due to unidentified influenza virus with other respiratory manifestations: Secondary | ICD-10-CM | POA: Diagnosis not present

## 2023-01-29 DIAGNOSIS — Z6827 Body mass index (BMI) 27.0-27.9, adult: Secondary | ICD-10-CM | POA: Diagnosis not present

## 2023-01-29 DIAGNOSIS — I493 Ventricular premature depolarization: Secondary | ICD-10-CM | POA: Diagnosis not present

## 2023-01-29 DIAGNOSIS — Z1331 Encounter for screening for depression: Secondary | ICD-10-CM | POA: Diagnosis not present

## 2023-01-29 DIAGNOSIS — I700 Atherosclerosis of aorta: Secondary | ICD-10-CM | POA: Diagnosis not present

## 2023-01-29 DIAGNOSIS — Z0000 Encounter for general adult medical examination without abnormal findings: Secondary | ICD-10-CM | POA: Diagnosis not present

## 2023-01-29 DIAGNOSIS — Z1231 Encounter for screening mammogram for malignant neoplasm of breast: Secondary | ICD-10-CM | POA: Diagnosis not present

## 2023-01-29 DIAGNOSIS — S90222A Contusion of left lesser toe(s) with damage to nail, initial encounter: Secondary | ICD-10-CM | POA: Diagnosis not present

## 2023-02-07 DIAGNOSIS — R0981 Nasal congestion: Secondary | ICD-10-CM | POA: Diagnosis not present

## 2023-02-07 DIAGNOSIS — R051 Acute cough: Secondary | ICD-10-CM | POA: Diagnosis not present

## 2023-02-07 DIAGNOSIS — R062 Wheezing: Secondary | ICD-10-CM | POA: Diagnosis not present

## 2023-02-07 DIAGNOSIS — R509 Fever, unspecified: Secondary | ICD-10-CM | POA: Diagnosis not present

## 2023-03-25 DIAGNOSIS — R051 Acute cough: Secondary | ICD-10-CM | POA: Diagnosis not present

## 2023-03-25 DIAGNOSIS — J069 Acute upper respiratory infection, unspecified: Secondary | ICD-10-CM | POA: Diagnosis not present

## 2023-06-10 DIAGNOSIS — R42 Dizziness and giddiness: Secondary | ICD-10-CM | POA: Diagnosis not present

## 2023-06-10 DIAGNOSIS — R001 Bradycardia, unspecified: Secondary | ICD-10-CM | POA: Diagnosis not present

## 2023-06-10 DIAGNOSIS — Z6825 Body mass index (BMI) 25.0-25.9, adult: Secondary | ICD-10-CM | POA: Diagnosis not present

## 2023-07-31 DIAGNOSIS — Z86018 Personal history of other benign neoplasm: Secondary | ICD-10-CM | POA: Diagnosis not present

## 2023-07-31 DIAGNOSIS — L821 Other seborrheic keratosis: Secondary | ICD-10-CM | POA: Diagnosis not present

## 2023-07-31 DIAGNOSIS — L814 Other melanin hyperpigmentation: Secondary | ICD-10-CM | POA: Diagnosis not present

## 2023-07-31 DIAGNOSIS — Z87898 Personal history of other specified conditions: Secondary | ICD-10-CM | POA: Diagnosis not present

## 2023-07-31 DIAGNOSIS — L578 Other skin changes due to chronic exposure to nonionizing radiation: Secondary | ICD-10-CM | POA: Diagnosis not present

## 2023-07-31 DIAGNOSIS — D225 Melanocytic nevi of trunk: Secondary | ICD-10-CM | POA: Diagnosis not present

## 2023-08-01 ENCOUNTER — Other Ambulatory Visit: Payer: Self-pay

## 2023-08-05 NOTE — Progress Notes (Unsigned)
Cardiology Office Note:  .   Date:  08/06/2023  ID:  Leah Steele, DOB Aug 21, 1946, MRN [id number] PCP: Philemon Kingdom, MD  [facility] Providers Cardiologist:  None    History of Present Illness: [name]   Leah Steele is a 77 y.o. female with a past medical history of PVCs, OSA, pulmonary nodules, melanoma, trigeminy/palpitations.  Evaluated by Dr. Tomie China on 07/24/2022 at the behest of her PCP for the evaluation of palpitations.  Echo was arranged and completed on 07/27/2022 revealing an EF 60 to 65%, mild LVH, grade 1 DD, trivial MR, aortic valve sclerosis without stenosis.  Monitor was arranged and completed on 07/24/2022 average heart rate 81 bpm, 19 episodes of SVT, frequent VE's at 34.8%, 2 runs of VT, bigeminy and trigeminy were present.  Based on these results a Lexiscan was ordered and completed on 08/30/2022, normal, low risk study.  She presents today for follow-up of her PVCs.  She is doing good from a cardiac perspective, offers no formal complaints related to her health.  She has had a lot of stress with a job change recently, and that has been an adjustment.  For her.  She has lost 6 pounds over the last year, she notices her clothes are fitting better, and this concerned her, she continues to have a good appetite though.  She is unaware of her PVC burden.  She denies chest pain, palpitations, dyspnea, pnd, orthopnea, n, v, dizziness, syncope,  weight gain, or early satiety.   ROS: Review of Systems  Cardiovascular:  Positive for leg swelling.  All other systems reviewed and are negative.    Studies Reviewed: .       Cardiac Studies & Procedures     STRESS TESTS  MYOCARDIAL PERFUSION IMAGING 09/04/2022  Narrative   The study is normal. The study is low risk.   Left ventricular function is normal. Nuclear stress EF: 55 %. The left ventricular ejection fraction is normal (55-65%). End diastolic cavity size is normal.   Prior study not available for comparison.    ECHOCARDIOGRAM  ECHOCARDIOGRAM COMPLETE 07/27/2022  Narrative ECHOCARDIOGRAM REPORT    Patient Name:   Leah Steele Date of Exam: 07/27/2022 Medical Rec #:  [id number]        Height:       64.0 in Accession #:    [id number]       Weight:       156.6 lb Date of Birth:  July 19, 1946        BSA:          1.763 m Patient Age:    76 years         BP:           122/54 mmHg Patient Gender: F                HR:           54 bpm. Exam Location:  [place]  Procedure: 2D Echo, Color Doppler and Limited Color Doppler  Indications:    Cardiac murmur [R01.1]  History:        Patient has no prior history of Echocardiogram examinations. Arrythmias:PVC; Risk Factors:Dyslipidemia.  Sonographer:    Margreta Journey RDCS Referring Phys: Rito Ehrlich [facility]   Sonographer Comments: Global longitudinal strain was attempted. IMPRESSIONS   1. Frequent PVC's with trigeminy Left ventricular ejection fraction, by estimation, is 60 to 65%. The left ventricle has normal function. The left ventricle has no regional wall motion abnormalities. There is  mild left ventricular hypertrophy. Left ventricular diastolic parameters are consistent with Grade I diastolic dysfunction (impaired relaxation). 2. Right ventricular systolic function is normal. The right ventricular size is normal. There is normal pulmonary artery systolic pressure. 3. The mitral valve is normal in structure. Trivial mitral valve regurgitation. No evidence of mitral stenosis. 4. The aortic valve is tricuspid. Aortic valve regurgitation is mild. Aortic valve sclerosis is present, with no evidence of aortic valve stenosis. 5. Aortic Normal DTA. 6. The inferior vena cava is normal in size with greater than 50% respiratory variability, suggesting right atrial pressure of 3 mmHg.  FINDINGS Left Ventricle: Frequent PVC's with trigeminy.  Left ventricular ejection fraction, by estimation, is 60 to 65%. The left ventricle has normal function.  The left ventricle has no regional wall motion abnormalities. The left ventricular internal cavity size was normal in size. There is mild left ventricular hypertrophy. Left ventricular diastolic parameters are consistent with Grade I diastolic dysfunction (impaired relaxation). Normal left ventricular filling pressure.  Right Ventricle: The right ventricular size is normal. No increase in right ventricular wall thickness. Right ventricular systolic function is normal. There is normal pulmonary artery systolic pressure. The tricuspid regurgitant velocity is 2.65 m/s, and with an assumed right atrial pressure of 3 mmHg, the estimated right ventricular systolic pressure is 31.1 mmHg.  Left Atrium: Left atrial size was normal in size.  Right Atrium: Right atrial size was normal in size.  Pericardium: There is no evidence of pericardial effusion.  Mitral Valve: The mitral valve is normal in structure. Trivial mitral valve regurgitation. No evidence of mitral valve stenosis.  Tricuspid Valve: The tricuspid valve is normal in structure. Tricuspid valve regurgitation is not demonstrated. No evidence of tricuspid stenosis.  Aortic Valve: The aortic valve is tricuspid. Aortic valve regurgitation is mild. Aortic regurgitation PHT measures 499 msec. Aortic valve sclerosis is present, with no evidence of aortic valve stenosis.  Pulmonic Valve: The pulmonic valve was normal in structure. Pulmonic valve regurgitation is not visualized. No evidence of pulmonic stenosis.  Aorta: The aortic root and ascending aorta are structurally normal, with no evidence of dilitation, the aortic arch was not well visualized and Normal DTA.  Venous: The inferior vena cava is normal in size with greater than 50% respiratory variability, suggesting right atrial pressure of 3 mmHg.  IAS/Shunts: No atrial level shunt detected by color flow Doppler.   LEFT VENTRICLE PLAX 2D LVIDd:         4.50 cm   Diastology LVIDs:          3.20 cm   LV e' medial:    6.64 cm/s LV PW:         1.00 cm   LV E/e' medial:  10.3 LV IVS:        1.20 cm   LV e' lateral:   5.98 cm/s LVOT diam:     1.80 cm   LV E/e' lateral: 11.4 LV SV:         50 LV SV Index:   29 LVOT Area:     2.54 cm   RIGHT VENTRICLE             IVC RV Basal diam:  2.90 cm     IVC diam: 1.70 cm RV S prime:     10.80 cm/s TAPSE (M-mode): 1.9 cm  LEFT ATRIUM           Index        RIGHT ATRIUM  Index LA diam:      3.30 cm 1.87 cm/m   RA Area:     14.70 cm LA Vol (A2C): 47.0 ml 26.66 ml/m  RA Volume:   36.70 ml  20.81 ml/m LA Vol (A4C): 39.1 ml 22.18 ml/m AORTIC VALVE LVOT Vmax:   82.50 cm/s LVOT Vmean:  58.200 cm/s LVOT VTI:    0.198 m AI PHT:      499 msec  AORTA Ao Root diam: 2.60 cm Ao Asc diam:  3.10 cm Ao Desc diam: 1.70 cm  MITRAL VALVE               TRICUSPID VALVE MV Area (PHT): 3.99 cm    TR Peak grad:   28.1 mmHg MV Decel Time: 190 msec    TR Vmax:        265.00 cm/s MV E velocity: 68.40 cm/s MV A velocity: 93.00 cm/s  SHUNTS MV E/A ratio:  0.74        Systemic VTI:  0.20 m Systemic Diam: 1.80 cm  Norman Herrlich MD Electronically signed by Norman Herrlich MD Signature Date/Time: 07/27/2022/5:32:15 PM    Final    MONITORS  LONG TERM MONITOR (3-14 DAYS) 08/20/2022  Narrative Patch Wear Time:  13 days and 23 hours (2023-08-01T16:12:44-0400 to 2023-08-15T16:04:04-399)  Patient had a min HR of 57 bpm, max HR of 197 bpm, and avg HR of 81 bpm.  Predominant underlying rhythm was Sinus Rhythm.  19 Supraventricular Tachycardia runs occurred, the run with the fastest interval lasting 10 beats with a max rate of 197 bpm, the longest lasting 10.4 secs with an avg rate of 134 bpm. Isolated SVEs were rare (<1.0%), SVE Couplets were rare (<1.0%), and SVE Triplets were rare (<1.0%).  Isolated VEs were frequent (34.8%, [id number]), VE Couplets were rare (<1.0%, 2609), and VE Triplets were rare (<1.0%, 152). Ventricular Bigeminy and  Trigeminy were present. 2 Ventricular Tachycardia runs occurred, the run with the fastest interval lasting 5 beats with a max rate of 158 bpm (avg 141 bpm); the run with the fastest interval was also the longest.           Risk Assessment/Calculations:             Physical Exam:   VS:  BP (!) 124/58   Pulse 73   Ht 5\' 4"  (1.626 m)   Wt 150 lb 3.2 oz (68.1 kg)   SpO2 93%   BMI 25.78 kg/m    Wt Readings from Last 3 Encounters:  08/06/23 150 lb 3.2 oz (68.1 kg)  08/30/22 156 lb (70.8 kg)  07/24/22 156 lb 9.6 oz (71 kg)    GEN: Well nourished, well developed in no acute distress NECK: No JVD; No carotid bruits CARDIAC: regular but noted PVCs on auscultatoin, no murmurs, rubs, gallops RESPIRATORY:  Clear to auscultation without rales, wheezing or rhonchi  ABDOMEN: Soft, non-tender, non-distended EXTREMITIES:  No edema; No deformity   ASSESSMENT AND PLAN: .   Trigeminy/bigeminy/SVT-asymptomatic, she is not aware of her PVCs.  Tolerating metoprolol well.  Lexiscan was negative and echo revealed preserved EF.  Aortic valve sclerosis -no murmur appreciated today. Weight loss-she was concerned about unintended weight loss, reviewed her weight and she is down 6 pounds since she was last evaluated a year ago.  She continues to have a good appetite.  We discussed checking her TSH however she was concerned that her insurance may not pay for this and decided to wait and discuss it further with  her PCP.       Dispo: Follow up in 1 year.   Signed, Flossie Dibble, NP

## 2023-08-06 ENCOUNTER — Encounter: Payer: Self-pay | Admitting: Cardiology

## 2023-08-06 ENCOUNTER — Ambulatory Visit: Payer: Medicare Other | Attending: Cardiology | Admitting: Cardiology

## 2023-08-06 VITALS — BP 124/58 | HR 73 | Ht 64.0 in | Wt 150.2 lb

## 2023-08-06 DIAGNOSIS — I493 Ventricular premature depolarization: Secondary | ICD-10-CM | POA: Diagnosis present

## 2023-08-06 DIAGNOSIS — I358 Other nonrheumatic aortic valve disorders: Secondary | ICD-10-CM

## 2023-08-06 DIAGNOSIS — R008 Other abnormalities of heart beat: Secondary | ICD-10-CM

## 2023-08-06 DIAGNOSIS — R002 Palpitations: Secondary | ICD-10-CM

## 2023-08-06 DIAGNOSIS — R634 Abnormal weight loss: Secondary | ICD-10-CM | POA: Diagnosis present

## 2023-08-06 DIAGNOSIS — I498 Other specified cardiac arrhythmias: Secondary | ICD-10-CM | POA: Diagnosis present

## 2023-08-06 NOTE — Patient Instructions (Signed)
Medication Instructions:  Your physician recommends that you continue on your current medications as directed. Please refer to the Current Medication list given to you today.  *If you need a refill on your cardiac medications before your next appointment, please call your pharmacy*   Lab Work: NONE If you have labs (blood work) drawn today and your tests are completely normal, you will receive your results only by: MyChart Message (if you have MyChart) OR A paper copy in the mail If you have any lab test that is abnormal or we need to change your treatment, we will call you to review the results.   Testing/Procedures: NONE   Follow-Up: At [facility], you and your health needs are our priority.  As part of our continuing mission to provide you with exceptional heart care, we have created designated Provider Care Teams.  These Care Teams include your primary Cardiologist (physician) and Advanced Practice Providers (APPs -  Physician Assistants and Nurse Practitioners) who all work together to provide you with the care you need, when you need it.  We recommend signing up for the patient portal called "MyChart".  Sign up information is provided on this After Visit Summary.  MyChart is used to connect with patients for Virtual Visits (Telemedicine).  Patients are able to view lab/test results, encounter notes, upcoming appointments, etc.  Non-urgent messages can be sent to your provider as well.   To learn more about what you can do with MyChart, go to [email or web address].    Your next appointment:   1 year(s)  Provider:   Rajan Revankar, MD    Other Instructions   

## 2023-08-21 ENCOUNTER — Other Ambulatory Visit: Payer: Self-pay | Admitting: Cardiology

## 2023-09-26 DIAGNOSIS — S61412A Laceration without foreign body of left hand, initial encounter: Secondary | ICD-10-CM | POA: Diagnosis not present

## 2023-10-09 DIAGNOSIS — R051 Acute cough: Secondary | ICD-10-CM | POA: Diagnosis not present

## 2023-10-09 DIAGNOSIS — R5383 Other fatigue: Secondary | ICD-10-CM | POA: Diagnosis not present

## 2023-10-09 DIAGNOSIS — R0981 Nasal congestion: Secondary | ICD-10-CM | POA: Diagnosis not present

## 2023-10-09 DIAGNOSIS — R0602 Shortness of breath: Secondary | ICD-10-CM | POA: Diagnosis not present

## 2023-10-14 ENCOUNTER — Telehealth: Payer: Self-pay | Admitting: Cardiology

## 2023-10-14 NOTE — Telephone Encounter (Signed)
Patient states for about the past 3 days she has been feeling a sensation in her lower abdomen/upper groin of blood running through her veins.

## 2023-10-15 NOTE — Telephone Encounter (Signed)
Spoke with pt and advised to see PCP or Urgent Care. Pt verbalized understanding and had no additional questions.

## 2023-12-10 DIAGNOSIS — R6889 Other general symptoms and signs: Secondary | ICD-10-CM | POA: Diagnosis not present

## 2023-12-10 DIAGNOSIS — M7989 Other specified soft tissue disorders: Secondary | ICD-10-CM | POA: Diagnosis not present

## 2023-12-10 DIAGNOSIS — L309 Dermatitis, unspecified: Secondary | ICD-10-CM | POA: Diagnosis not present

## 2023-12-10 DIAGNOSIS — Z6826 Body mass index (BMI) 26.0-26.9, adult: Secondary | ICD-10-CM | POA: Diagnosis not present

## 2023-12-10 DIAGNOSIS — I493 Ventricular premature depolarization: Secondary | ICD-10-CM | POA: Diagnosis not present

## 2023-12-31 DIAGNOSIS — E875 Hyperkalemia: Secondary | ICD-10-CM | POA: Diagnosis not present

## 2024-02-06 DIAGNOSIS — Z1331 Encounter for screening for depression: Secondary | ICD-10-CM | POA: Diagnosis not present

## 2024-02-06 DIAGNOSIS — Z1231 Encounter for screening mammogram for malignant neoplasm of breast: Secondary | ICD-10-CM | POA: Diagnosis not present

## 2024-02-06 DIAGNOSIS — Z6826 Body mass index (BMI) 26.0-26.9, adult: Secondary | ICD-10-CM | POA: Diagnosis not present

## 2024-02-06 DIAGNOSIS — Z0000 Encounter for general adult medical examination without abnormal findings: Secondary | ICD-10-CM | POA: Diagnosis not present

## 2024-02-19 DIAGNOSIS — R070 Pain in throat: Secondary | ICD-10-CM | POA: Diagnosis not present

## 2024-02-19 DIAGNOSIS — R0981 Nasal congestion: Secondary | ICD-10-CM | POA: Diagnosis not present

## 2024-02-19 DIAGNOSIS — R051 Acute cough: Secondary | ICD-10-CM | POA: Diagnosis not present

## 2024-02-26 DIAGNOSIS — J9801 Acute bronchospasm: Secondary | ICD-10-CM | POA: Diagnosis not present

## 2024-02-26 DIAGNOSIS — Z6826 Body mass index (BMI) 26.0-26.9, adult: Secondary | ICD-10-CM | POA: Diagnosis not present

## 2024-02-26 DIAGNOSIS — J40 Bronchitis, not specified as acute or chronic: Secondary | ICD-10-CM | POA: Diagnosis not present

## 2024-04-16 DIAGNOSIS — R0602 Shortness of breath: Secondary | ICD-10-CM | POA: Diagnosis not present

## 2024-04-16 DIAGNOSIS — R001 Bradycardia, unspecified: Secondary | ICD-10-CM | POA: Diagnosis not present

## 2024-04-16 DIAGNOSIS — E039 Hypothyroidism, unspecified: Secondary | ICD-10-CM | POA: Diagnosis not present

## 2024-04-16 DIAGNOSIS — R1010 Upper abdominal pain, unspecified: Secondary | ICD-10-CM | POA: Diagnosis not present

## 2024-04-16 DIAGNOSIS — Z6826 Body mass index (BMI) 26.0-26.9, adult: Secondary | ICD-10-CM | POA: Diagnosis not present

## 2024-04-16 DIAGNOSIS — R600 Localized edema: Secondary | ICD-10-CM | POA: Diagnosis not present

## 2024-04-20 ENCOUNTER — Telehealth: Payer: Self-pay | Admitting: *Deleted

## 2024-04-20 DIAGNOSIS — R001 Bradycardia, unspecified: Secondary | ICD-10-CM

## 2024-04-20 NOTE — Telephone Encounter (Signed)
 Received fax from [facility] requesting Zio heart monitor be scheduled for pt; 7 day zio for Bradycardia ordered.

## 2024-04-24 DIAGNOSIS — R1011 Right upper quadrant pain: Secondary | ICD-10-CM | POA: Diagnosis not present

## 2024-04-24 DIAGNOSIS — R1010 Upper abdominal pain, unspecified: Secondary | ICD-10-CM | POA: Diagnosis not present

## 2024-04-24 DIAGNOSIS — R110 Nausea: Secondary | ICD-10-CM | POA: Diagnosis not present

## 2024-04-27 DIAGNOSIS — E785 Hyperlipidemia, unspecified: Secondary | ICD-10-CM | POA: Diagnosis not present

## 2024-04-27 DIAGNOSIS — R1010 Upper abdominal pain, unspecified: Secondary | ICD-10-CM | POA: Diagnosis not present

## 2024-04-27 DIAGNOSIS — R0602 Shortness of breath: Secondary | ICD-10-CM | POA: Diagnosis not present

## 2024-04-27 DIAGNOSIS — E039 Hypothyroidism, unspecified: Secondary | ICD-10-CM | POA: Diagnosis not present

## 2024-04-28 ENCOUNTER — Ambulatory Visit: Attending: Internal Medicine

## 2024-04-28 DIAGNOSIS — R001 Bradycardia, unspecified: Secondary | ICD-10-CM

## 2024-05-01 DIAGNOSIS — R0602 Shortness of breath: Secondary | ICD-10-CM | POA: Diagnosis not present

## 2024-05-01 DIAGNOSIS — I083 Combined rheumatic disorders of mitral, aortic and tricuspid valves: Secondary | ICD-10-CM | POA: Diagnosis not present

## 2024-05-14 ENCOUNTER — Telehealth: Payer: Self-pay

## 2024-05-14 DIAGNOSIS — R7989 Other specified abnormal findings of blood chemistry: Secondary | ICD-10-CM | POA: Diagnosis not present

## 2024-05-14 NOTE — Telephone Encounter (Signed)
 Spoke with Loreda Rodriguez At [facility] and she stated she would fax Echo report to our office. Fax number provided.

## 2024-05-15 LAB — LAB REPORT - SCANNED: EGFR: 52

## 2024-05-20 DIAGNOSIS — R001 Bradycardia, unspecified: Secondary | ICD-10-CM | POA: Diagnosis not present

## 2024-05-21 ENCOUNTER — Encounter: Payer: Self-pay | Admitting: Cardiology

## 2024-05-21 ENCOUNTER — Ambulatory Visit: Attending: Cardiology | Admitting: Cardiology

## 2024-05-21 VITALS — BP 108/56 | HR 42 | Ht 64.0 in | Wt 150.0 lb

## 2024-05-21 DIAGNOSIS — I4729 Other ventricular tachycardia: Secondary | ICD-10-CM | POA: Diagnosis not present

## 2024-05-21 DIAGNOSIS — E782 Mixed hyperlipidemia: Secondary | ICD-10-CM | POA: Diagnosis not present

## 2024-05-21 DIAGNOSIS — I2510 Atherosclerotic heart disease of native coronary artery without angina pectoris: Secondary | ICD-10-CM | POA: Diagnosis not present

## 2024-05-21 DIAGNOSIS — R0609 Other forms of dyspnea: Secondary | ICD-10-CM | POA: Insufficient documentation

## 2024-05-21 HISTORY — DX: Other ventricular tachycardia: I47.29

## 2024-05-21 HISTORY — DX: Atherosclerotic heart disease of native coronary artery without angina pectoris: I25.10

## 2024-05-21 HISTORY — DX: Other forms of dyspnea: R06.09

## 2024-05-21 NOTE — Patient Instructions (Signed)
 Medication Instructions:  Your physician recommends that you continue on your current medications as directed. Please refer to the Current Medication list given to you today.  *If you need a refill on your cardiac medications before your next appointment, please call your pharmacy*   Lab Work: None ordered If you have labs (blood work) drawn today and your tests are completely normal, you will receive your results only by: MyChart Message (if you have MyChart) OR A paper copy in the mail If you have any lab test that is abnormal or we need to change your treatment, we will call you to review the results.   Testing/Procedures:   [facility] [address] [place], [place] [address] Phone:  [phone]  May 21, 2024    Paolina Karwowski DOB: 01/31/1946 MRN: [id number] [address] [place] [address]   Dear Ms. Murtha,  Please arrive 15 minutes prior to your appointment time for registration and insurance purposes.  The test will take approximately 3 to 4 hours to complete; you may bring reading material.  If someone comes with you to your appointment, they will need to remain in the main lobby due to limited space in the testing area.   How to prepare for your Myocardial Perfusion Test: Do not eat or drink 3 hours prior to your test, except you may have water. Do not consume products containing caffeine (regular or decaffeinated) 12 hours prior to your test. (ex: coffee, chocolate, sodas, tea). Do bring a list of your current medications with you.  If not listed below, you may take your medications as normal. Do not take metoprolol  (Lopressor , Toprol ) for 24 hours prior to the test.  Bring the medication to your appointment as you may be required to take it once the test is complete. Do wear comfortable clothes (no dresses or overalls) and walking shoes, tennis shoes preferred (No heels or open toe shoes are allowed). Do NOT wear cologne,  perfume, aftershave, or lotions (deodorant is allowed). If these instructions are not followed, your test will have to be rescheduled.  Please report to [address] for your test.  If you have questions or concerns about your appointment, you can call the [facility] at [phone].  If you cannot keep your appointment, please provide 24 hours notification to the Nuclear Lab, to avoid a possible $50 charge to your account.   Follow-Up: At [facility], you and your health needs are our priority.  As part of our continuing mission to provide you with exceptional heart care, we have created designated Provider Care Teams.  These Care Teams include your primary Cardiologist (physician) and Advanced Practice Providers (APPs -  Physician Assistants and Nurse Practitioners) who all work together to provide you with the care you need, when you need it.  We recommend signing up for the patient portal called "MyChart".  Sign up information is provided on this After Visit Summary.  MyChart is used to connect with patients for Virtual Visits (Telemedicine).  Patients are able to view lab/test results, encounter notes, upcoming appointments, etc.  Non-urgent messages can be sent to your provider as well.   To learn more about what you can do with MyChart, go to [email or web address].    Your next appointment:   6 month(s)  The format for your next appointment:   In Person  Provider:   Hillis Lu, MD    Other Instructions none  Important Information  About Sugar

## 2024-05-21 NOTE — Progress Notes (Signed)
 Cardiology Office Note:    Date:  05/21/2024   ID:  Leah Steele, DOB [birth year], MRN [id number]  PCP:  Olan Bering, MD  Cardiologist:  Nelia Balzarine, MD   Referring MD: Olan Bering, MD    ASSESSMENT:    1. 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 Nonsustained ventricular tachycardia (HCC)   3. Mixed hyperlipidemia   4. DOE (dyspnea on exertion)    PLAN:    In order of problems listed above:  Dyspnea on exertion and coronary artery calcification: Secondary prevention stressed with patient.  Importance of compliance with diet medication stressed and vocalized understanding.  Will do exercise stress Cardiolite to assess her. Nonsustained ventricular tachycardia: I reassured her about my findings.  She is on beta-blocker.  I will see if her blood work is done recently.  Will do a magnesium level also. Mixed dyslipidemia: Not on lipid-lowering medications.  Will obtain blood work report and initiate on statins.  Will try to get in touch with her primary care physician for the same. Patient will be seen in follow-up appointment in 6 months or earlier if the patient has any concerns.    Medication Adjustments/Labs and Tests Ordered: Current medicines are reviewed at length with the patient today.  Concerns regarding medicines are outlined above.  No orders of the defined types were placed in this encounter.  No orders of the defined types were placed in this encounter.    No chief complaint on file.    History of Present Illness:    Leah Steele is a 78 y.o. female.  Patient has past medical history of coronary artery calcification on CT scan, nonsustained ventricular tachycardia and mixed dyslipidemia.  She denies any problems at this time and takes care of activities of daily living.  She has some dyspnea on exertion.  She tells me that she walks a mile on a daily basis without any problems.  At the time of my evaluation, the patient is alert  awake oriented and in no distress.  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   Hallux valgus, acquired 07/15/2017   History of malignant melanoma of skin 10/08/2013   Hyperlipidemia    Ingrown nail 05/17/2022   Melanocytic nevus 11/13/2013   Melanoma of upper arm, left (HCC)    Metatarsalgia of both feet 12/30/2017   OSA (obstructive sleep apnea)    Palpitations 07/24/2022   Pre-ulcerative calluses 12/30/2017   Pulmonary nodules    PVC (premature ventricular contraction)    Swelling    Toenail fungus 05/17/2022   Trigeminy     Past Surgical History:  Procedure Laterality Date   FOOT SURGERY     melanoma surgery     melanoma excision with extensive facial plastic surgery    Current Medications: Current Meds  Medication Sig   Ascorbic Acid (VITAMIN C) 1000 MG tablet Take 1,000 mg by mouth daily.   calcium carbonate (OSCAL) 1500 (600 Ca) MG TABS tablet Take 1,500 mg by mouth daily.   levothyroxine (SYNTHROID) 50 MCG tablet Take 50 mcg by mouth daily.   metoprolol  succinate (TOPROL -XL) 25 MG 24 hr tablet Take 0.5 tablets (12.5 mg total)  by mouth daily.   Multiple Vitamin (MULTIVITAMIN PO) Take 1 tablet by mouth daily.     Allergies:   Patient has no known allergies.   Social History   Socioeconomic History   Marital status: Widowed    Spouse name: Not on file   Number of children: Not on file   Years of education: Not on file   Highest education level: Not on file  Occupational History   Not on file  Tobacco Use   Smoking status: Former    Current packs/day: 0.00    Average packs/day: 4.0 packs/day for 20.0 years (80.0 ttl pk-yrs)    Types: Cigarettes    Start date: 76    Quit  date: 21    Years since quitting: 27.4   Smokeless tobacco: Never  Vaping Use   Vaping status: Never Used  Substance and Sexual Activity   Alcohol  use: Yes    Alcohol /week: 1.0 standard drink of alcohol     Types: 1 Glasses of wine per week   Drug use: No   Sexual activity: Not on file  Other Topics Concern   Not on file  Social History Narrative   Not on file   Social Drivers of Health   Financial Resource Strain: Not on file  Food Insecurity: Not on file  Transportation Needs: Not on file  Physical Activity: Not on file  Stress: Not on file  Social Connections: Not on file     Family History: The patient's family history includes Breast cancer in her paternal grandmother; Cancer in her sister.  ROS:   Please see the history of present illness.    All other systems reviewed and are negative.  EKGs/Labs/Other Studies Reviewed:    The following studies were reviewed today: I discussed my findings with the patient at length   Recent Labs: No results found for requested labs within last 365 days.  Recent Lipid Panel No results found for: "CHOL", "TRIG", "HDL", "CHOLHDL", "VLDL", "LDLCALC", "LDLDIRECT"  Physical Exam:    VS:  BP (!) 108/56   Pulse (!) 42   Ht 5\' 4"  (1.626 m)   Wt 150 lb (68 kg)   SpO2 99%   BMI 25.75 kg/m     Wt Readings from Last 3 Encounters:  05/21/24 150 lb (68 kg)  08/06/23 150 lb 3.2 oz (68.1 kg)  08/30/22 156 lb (70.8 kg)     GEN: Patient is in no acute distress HEENT: Normal NECK: No JVD; No carotid bruits LYMPHATICS: No lymphadenopathy CARDIAC: Hear sounds regular, 2/6 systolic murmur at the apex. RESPIRATORY:  Clear to auscultation without rales, wheezing or rhonchi  ABDOMEN: Soft, non-tender, non-distended MUSCULOSKELETAL:  No edema; No deformity  SKIN: Warm and dry NEUROLOGIC:  Alert and oriented x 3 PSYCHIATRIC:  Normal affect   Signed, Nelia Balzarine, MD  05/21/2024 11:00 AM    [facility]

## 2024-05-22 ENCOUNTER — Telehealth (HOSPITAL_COMMUNITY): Payer: Self-pay | Admitting: *Deleted

## 2024-05-22 NOTE — Telephone Encounter (Signed)
 Left a detailed message on voice mail regarding her stress test on 05/27/24 at 8:00. I also reminded her not to take her Metoprolol  for 24 hours before her test.

## 2024-05-27 ENCOUNTER — Ambulatory Visit: Attending: Cardiology

## 2024-05-27 DIAGNOSIS — I2510 Atherosclerotic heart disease of native coronary artery without angina pectoris: Secondary | ICD-10-CM | POA: Insufficient documentation

## 2024-05-27 MED ORDER — TECHNETIUM TC 99M TETROFOSMIN IV KIT
28.7000 | PACK | Freq: Once | INTRAVENOUS | Status: AC | PRN
  Administered 2024-05-27: 28.7 via INTRAVENOUS

## 2024-05-27 MED ORDER — REGADENOSON 0.4 MG/5ML IV SOLN
0.4000 mg | Freq: Once | INTRAVENOUS | Status: AC
  Administered 2024-05-27: 0.4 mg via INTRAVENOUS

## 2024-05-27 MED ORDER — TECHNETIUM TC 99M TETROFOSMIN IV KIT
10.9000 | PACK | Freq: Once | INTRAVENOUS | Status: AC | PRN
  Administered 2024-05-27: 10.9 via INTRAVENOUS

## 2024-05-28 LAB — MYOCARDIAL PERFUSION IMAGING
LV dias vol: 59 mL (ref 46–106)
LV sys vol: 37 mL
Nuc Stress EF: 38 %
Peak HR: 87 {beats}/min
Rest HR: 76 {beats}/min
Rest Nuclear Isotope Dose: 10.9 mCi
SDS: 2
SRS: 3
SSS: 5
ST Depression (mm): 0 mm
Stress Nuclear Isotope Dose: 28.7 mCi
TID: 1

## 2024-05-29 ENCOUNTER — Ambulatory Visit: Admitting: Cardiology

## 2024-05-29 ENCOUNTER — Ambulatory Visit: Payer: Self-pay | Admitting: Cardiology

## 2024-06-08 DIAGNOSIS — R001 Bradycardia, unspecified: Secondary | ICD-10-CM | POA: Diagnosis not present

## 2024-07-16 ENCOUNTER — Ambulatory Visit: Admitting: Pulmonary Disease

## 2024-08-05 ENCOUNTER — Ambulatory Visit: Admitting: Cardiology

## 2024-08-31 ENCOUNTER — Other Ambulatory Visit: Payer: Self-pay | Admitting: Cardiology

## 2024-09-08 ENCOUNTER — Ambulatory Visit: Admitting: Pulmonary Disease

## 2024-09-09 DIAGNOSIS — Z23 Encounter for immunization: Secondary | ICD-10-CM | POA: Diagnosis not present

## 2024-09-30 DIAGNOSIS — Z23 Encounter for immunization: Secondary | ICD-10-CM | POA: Diagnosis not present

## 2024-11-02 ENCOUNTER — Ambulatory Visit: Admitting: Pulmonary Disease

## 2024-11-05 ENCOUNTER — Ambulatory Visit: Admitting: Cardiology

## 2024-11-09 DIAGNOSIS — D225 Melanocytic nevi of trunk: Secondary | ICD-10-CM | POA: Insufficient documentation

## 2024-11-09 DIAGNOSIS — D692 Other nonthrombocytopenic purpura: Secondary | ICD-10-CM | POA: Insufficient documentation

## 2024-11-09 DIAGNOSIS — Z86006 Personal history of melanoma in-situ: Secondary | ICD-10-CM | POA: Insufficient documentation

## 2024-11-09 DIAGNOSIS — Z87898 Personal history of other specified conditions: Secondary | ICD-10-CM | POA: Insufficient documentation

## 2024-11-10 ENCOUNTER — Encounter: Payer: Self-pay | Admitting: Cardiology

## 2024-11-10 ENCOUNTER — Ambulatory Visit: Attending: Cardiology | Admitting: Cardiology

## 2024-11-10 VITALS — BP 112/76 | HR 83 | Ht 64.0 in | Wt 140.2 lb

## 2024-11-10 DIAGNOSIS — Z131 Encounter for screening for diabetes mellitus: Secondary | ICD-10-CM | POA: Diagnosis not present

## 2024-11-10 DIAGNOSIS — E785 Hyperlipidemia, unspecified: Secondary | ICD-10-CM | POA: Diagnosis not present

## 2024-11-10 DIAGNOSIS — Z1321 Encounter for screening for nutritional disorder: Secondary | ICD-10-CM | POA: Insufficient documentation

## 2024-11-10 DIAGNOSIS — E559 Vitamin D deficiency, unspecified: Secondary | ICD-10-CM | POA: Diagnosis not present

## 2024-11-10 NOTE — Progress Notes (Signed)
 Cardiology Office Note:    Date:  11/10/2024   ID:  Leah Steele, DOB [birth year], MRN [id number]  PCP:  Jefferey Fitch, MD  Cardiologist:  Jennifer JONELLE Crape, MD   Referring MD: Jefferey Fitch, MD    ASSESSMENT:    1. Hyperlipidemia, unspecified hyperlipidemia type   2. Encounter for vitamin deficiency screening   3. Diabetes mellitus screening    PLAN:    In order of problems listed above:  Coronary artery calcification: Secondary she was prevention stressed with the patient.  Importance of compliance with diet medication stressed and patient verbalized standing.  She was advised to walk at least half an hour a day on a daily basis. Essential hypertension: Blood pressure stable and diet was emphasized. Mixed dyslipidemia: She has not had blood work for some time.  I told her to come back in the next few days for complete blood work.  She has had history of vitamin D deficiency and we will check this for her. Obstructive apnea: Sleep health issues were discussed. Patient will be seen in follow-up appointment in 6 months or earlier if the patient has any concerns.    Medication Adjustments/Labs and Tests Ordered: Current medicines are reviewed at length with the patient today.  Concerns regarding medicines are outlined above.  Orders Placed This Encounter  Procedures   EKG 12-Lead   No orders of the defined types were placed in this encounter.    No chief complaint on file.    History of Present Illness:    Leah Steele is a 78 y.o. female.  Patient has past medical history of coronary artery calcification, aortic atherosclerosis, essential hypertension and mixed dyslipidemia.  She denies any problems at this time and takes care of activities of daily living.  No chest pain orthopnea or PND.  She has had history of nonsustained ventricular tachycardia.  She walks a mile a day on a regular basis.  At the time of my evaluation, the patient is alert awake  oriented and in no distress.  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   Hallux rigidus 08/21/2017   Hallux valgus, acquired 07/15/2017   History of malignant melanoma of skin 10/08/2013   History of melanoma in situ 11/09/2024   History of neoplasm 11/09/2024   Hyperlipidemia    Ingrown nail 05/17/2022   Melanocytic nevus 11/13/2013   Melanocytic nevus of trunk 11/09/2024   Melanoma of upper arm, left (HCC)    Metatarsalgia of both feet 12/30/2017   Nonsustained ventricular tachycardia (HCC) 05/21/2024   OSA (obstructive sleep apnea)    Palpitations 07/24/2022   Pre-ulcerative calluses 12/30/2017   Pulmonary nodules    Purpura 11/09/2024   PVC (premature ventricular contraction)    Swelling    Toenail fungus 05/17/2022   Trigeminy     Past Surgical History:  Procedure Laterality Date   FOOT SURGERY     melanoma surgery     melanoma excision with extensive facial plastic surgery    Current Medications: Current Meds  Medication Sig   Ascorbic Acid (VITAMIN C) 1000 MG tablet Take 1,000 mg by mouth  daily.   BIOTIN PO Take by mouth daily at 8 pm.   calcium carbonate (OSCAL) 1500 (600 Ca) MG TABS tablet Take 1,500 mg by mouth daily.   furosemide (LASIX) 20 MG tablet Take 20 mg by mouth daily.   levothyroxine (SYNTHROID) 50 MCG tablet Take 50 mcg by mouth daily.   metoprolol  succinate (TOPROL -XL) 25 MG 24 hr tablet TAKE 1/2 TABLET BY MOUTH DAILY.   Multiple Vitamin (MULTIVITAMIN PO) Take 1 tablet by mouth daily.     Allergies:   Patient has no known allergies.   Social History   Socioeconomic History   Marital status:  Widowed    Spouse name: Not on file   Number of children: Not on file   Years of education: Not on file   Highest education level: Not on file  Occupational History   Not on file  Tobacco Use   Smoking status: Former    Current packs/day: 0.00    Average packs/day: 4.0 packs/day for 20.0 years (80.0 ttl pk-yrs)    Types: Cigarettes    Start date: 29    Quit date: 48    Years since quitting: 27.8   Smokeless tobacco: Never  Vaping Use   Vaping status: Never Used  Substance and Sexual Activity   Alcohol  use: Yes    Alcohol /week: 1.0 standard drink of alcohol     Types: 1 Glasses of wine per week   Drug use: No   Sexual activity: Not on file  Other Topics Concern   Not on file  Social History Narrative   Not on file   Social Drivers of Health   Financial Resource Strain: Not on file  Food Insecurity: Not on file  Transportation Needs: Not on file  Physical Activity: Not on file  Stress: Not on file  Social Connections: Not on file     Family History: The patient's family history includes Breast cancer in her paternal grandmother; Cancer in her sister.  ROS:   Please see the history of present illness.    All other systems reviewed and are negative.  EKGs/Labs/Other Studies Reviewed:    The following studies were reviewed today: .SABRAEKG Interpretation Date/Time:  Tuesday November 10 2024 15:29:17 EST Ventricular Rate:  83 PR Interval:  170 QRS Duration:  66 QT Interval:  396 QTC Calculation: 465 R Axis:   71  Text Interpretation: Sinus rhythm with frequent Premature ventricular complexes in a pattern of bigeminy Possible Left atrial enlargement ST & T wave abnormality, consider lateral ischemia Abnormal ECG When compared with ECG of 06-Aug-2023 08:35, Criteria for Anterior infarct are no longer Present T wave inversion now evident in Lateral leads Confirmed by Edwyna Backers [phone]) on 11/10/2024 3:47:36 PM     Recent Labs: No results found for requested  labs within last 365 days.  Recent Lipid Panel No results found for: CHOL, TRIG, HDL, CHOLHDL, VLDL, LDLCALC, LDLDIRECT  Physical Exam:    VS:  BP 112/76   Pulse 83   Ht 5' 4 (1.626 m)   Wt 140 lb 3.2 oz (63.6 kg)   SpO2 94%   BMI 24.07 kg/m     Wt Readings from Last 3 Encounters:  11/10/24 140 lb 3.2 oz (63.6 kg)  05/27/24 150 lb (68 kg)  05/21/24 150 lb (68 kg)     GEN: Patient is in no acute distress HEENT: Normal NECK: No JVD; No carotid bruits LYMPHATICS: No lymphadenopathy CARDIAC: Hear sounds regular, 2/6 systolic murmur at the apex. RESPIRATORY:  Clear to auscultation without rales, wheezing or rhonchi  ABDOMEN: Soft, non-tender, non-distended MUSCULOSKELETAL:  No edema; No deformity  SKIN: Warm and dry NEUROLOGIC:  Alert and oriented x 3 PSYCHIATRIC:  Normal affect   Signed, Jennifer JONELLE Crape, MD  11/10/2024 4:14 PM     [facility]

## 2024-11-10 NOTE — Patient Instructions (Signed)
 Medication Instructions:  Your physician recommends that you continue on your current medications as directed. Please refer to the Current Medication list given to you today.  *If you need a refill on your cardiac medications before your next appointment, please call your pharmacy*   Lab Work: Your physician recommends that you return for lab work in the next few days CMET, lipids, CBC, TSH, Vitamin D, & A1C You need to have labs done when you are fasting.  You can come Monday through Friday 8:30 am to 12:00 pm and 1:15 to 4:30. You do not need to make an appointment as the order has already been placed.   If you have labs (blood work) drawn today and your tests are completely normal, you will receive your results only by: MyChart Message (if you have MyChart) OR A paper copy in the mail If you have any lab test that is abnormal or we need to change your treatment, we will call you to review the results.   Testing/Procedures: None ordered   Follow-Up: At [facility], you and your health needs are our priority.  As part of our continuing mission to provide you with exceptional heart care, we have created designated Provider Care Teams.  These Care Teams include your primary Cardiologist (physician) and Advanced Practice Providers (APPs -  Physician Assistants and Nurse Practitioners) who all work together to provide you with the care you need, when you need it.  We recommend signing up for the patient portal called MyChart.  Sign up information is provided on this After Visit Summary.  MyChart is used to connect with patients for Virtual Visits (Telemedicine).  Patients are able to view lab/test results, encounter notes, upcoming appointments, etc.  Non-urgent messages can be sent to your provider as well.   To learn more about what you can do with MyChart, go to [email or web address].    Your next appointment:   6 month(s)  The format for your next appointment:   In  Person  Provider:   Jennifer Crape, MD    Other Instructions none  Important Information About Sugar

## 2024-11-23 ENCOUNTER — Ambulatory Visit: Admitting: Pulmonary Disease

## 2025-01-11 ENCOUNTER — Ambulatory Visit: Admitting: Pulmonary Disease
